# Patient Record
Sex: Male | Born: 1945 | Race: White | Hispanic: No | Marital: Married
Health system: Southern US, Community
[De-identification: ages and names within clinical notes are randomized; demographics above are authoritative.]

---

## 2007-10-31 ENCOUNTER — Other Ambulatory Visit: Admission: RE | Admit: 2007-10-31 | Discharge: 2007-10-31 | Payer: Self-pay | Admitting: Family Medicine

## 2007-10-31 ENCOUNTER — Encounter (INDEPENDENT_AMBULATORY_CARE_PROVIDER_SITE_OTHER): Payer: Self-pay | Admitting: Family Medicine

## 2009-10-28 ENCOUNTER — Ambulatory Visit (HOSPITAL_COMMUNITY): Admission: RE | Admit: 2009-10-28 | Discharge: 2009-10-28 | Payer: Self-pay | Admitting: Family Medicine

## 2011-10-26 ENCOUNTER — Other Ambulatory Visit (HOSPITAL_COMMUNITY): Payer: Self-pay | Admitting: Family Medicine

## 2011-10-26 ENCOUNTER — Ambulatory Visit (HOSPITAL_COMMUNITY)
Admission: RE | Admit: 2011-10-26 | Discharge: 2011-10-26 | Disposition: A | Discharge status: Home / Self Care | Payer: BC Managed Care – PPO | Source: Ambulatory Visit | Attending: Family Medicine | Admitting: Family Medicine

## 2011-10-26 DIAGNOSIS — Z0000 Encounter for general adult medical examination without abnormal findings: Secondary | ICD-10-CM

## 2011-10-26 DIAGNOSIS — K219 Gastro-esophageal reflux disease without esophagitis: Secondary | ICD-10-CM

## 2013-08-07 ENCOUNTER — Ambulatory Visit (HOSPITAL_COMMUNITY)
Admission: RE | Admit: 2013-08-07 | Discharge: 2013-08-07 | Disposition: A | Discharge status: Home / Self Care | Payer: BC Managed Care – PPO | Source: Ambulatory Visit | Attending: Family Medicine | Admitting: Family Medicine

## 2013-08-07 ENCOUNTER — Other Ambulatory Visit (HOSPITAL_COMMUNITY): Payer: Self-pay | Admitting: Family Medicine

## 2013-08-07 ENCOUNTER — Encounter (INDEPENDENT_AMBULATORY_CARE_PROVIDER_SITE_OTHER): Payer: Self-pay

## 2013-08-07 DIAGNOSIS — R059 Cough, unspecified: Secondary | ICD-10-CM

## 2013-08-07 DIAGNOSIS — R05 Cough: Secondary | ICD-10-CM | POA: Insufficient documentation

## 2013-08-07 DIAGNOSIS — F17200 Nicotine dependence, unspecified, uncomplicated: Secondary | ICD-10-CM | POA: Insufficient documentation

## 2014-12-18 IMAGING — CR DG CHEST 2V
2 series · 2 of 2 positions shown · non-contrast
Comparison: Chest x-ray 10/26/2011.

CLINICAL DATA: Physical exam.  Smoker.

EXAM:
CHEST  2 VIEW

[view not recorded (1 of 2)]
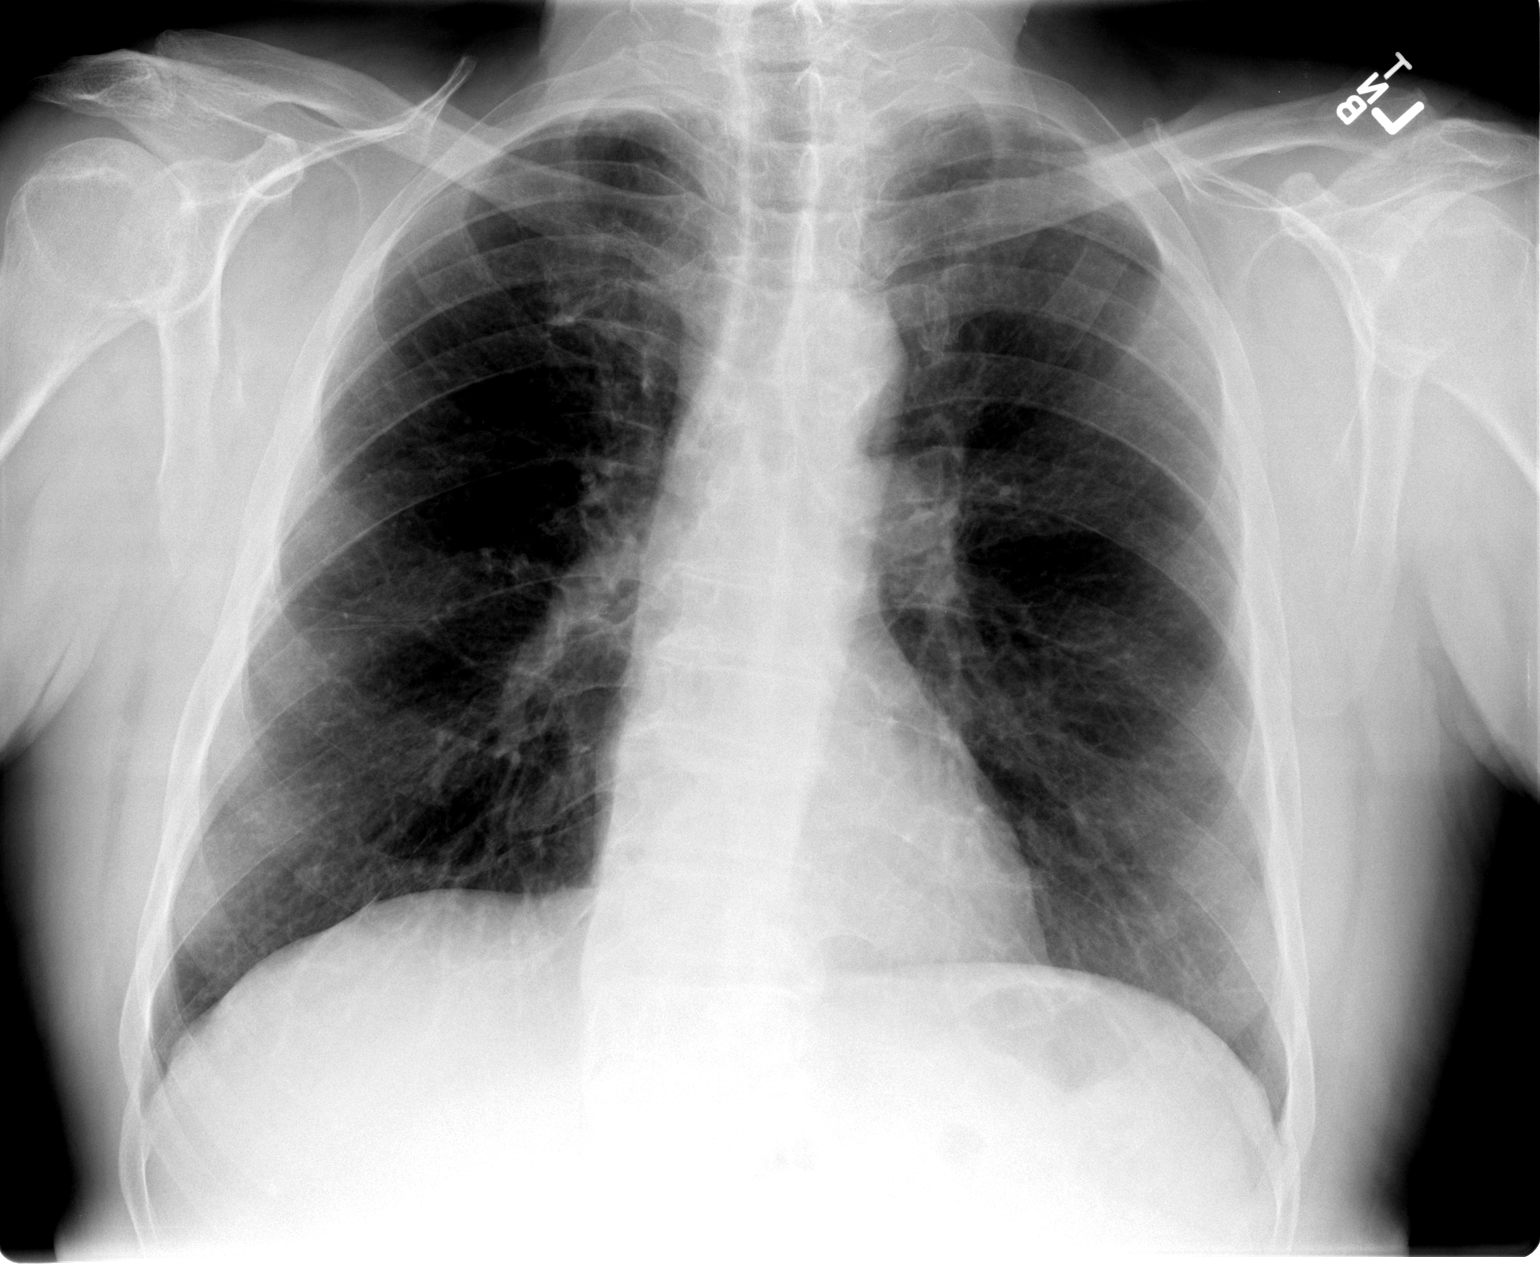

[view not recorded (2 of 2)]
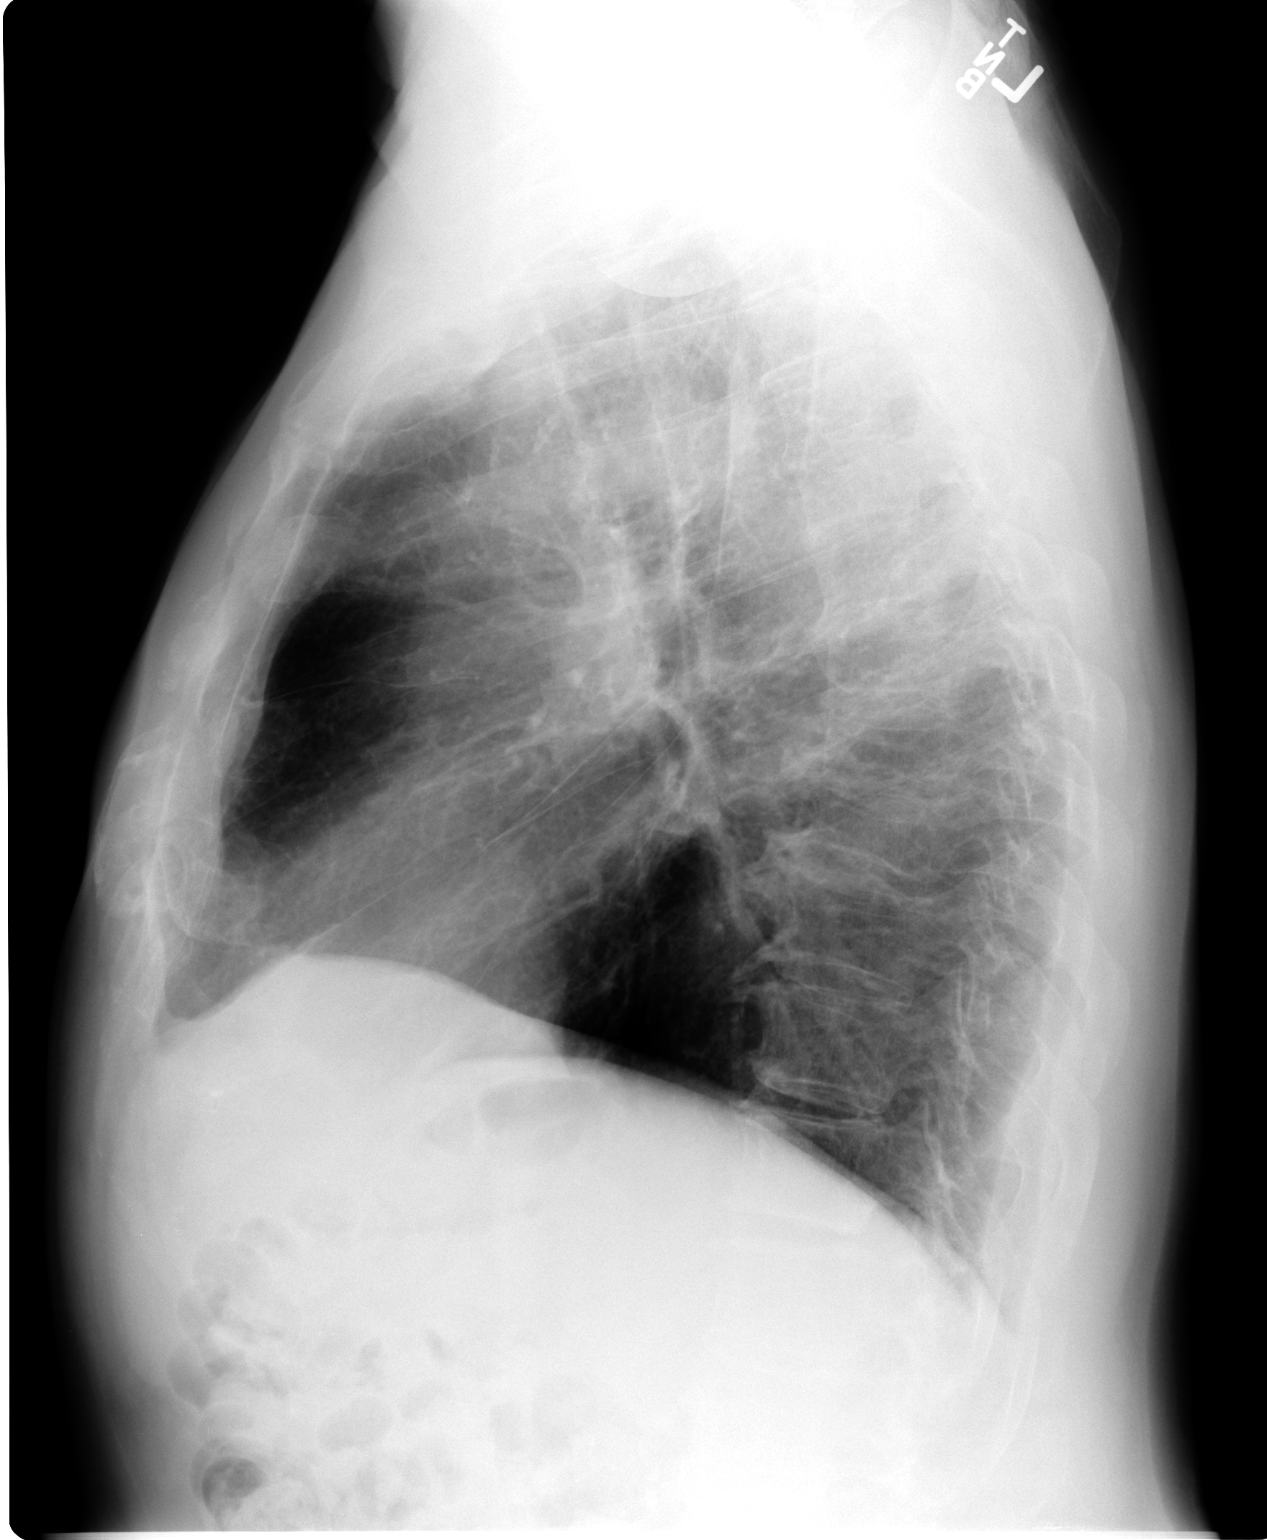

[2 of 2 positions shown; findings below may reference images not displayed]

FINDINGS: Mediastinum and hilar structures are normal. The lungs are clear.
Heart size normal. Pulmonary vascularity normal. Nipple shadows
again noted. These appear stable. No acute bony abnormality .
IMPRESSION: No active cardiopulmonary disease.

## 2015-08-24 DIAGNOSIS — Z6826 Body mass index (BMI) 26.0-26.9, adult: Secondary | ICD-10-CM | POA: Diagnosis not present

## 2015-08-24 DIAGNOSIS — I10 Essential (primary) hypertension: Secondary | ICD-10-CM | POA: Diagnosis not present

## 2015-08-25 DIAGNOSIS — R7989 Other specified abnormal findings of blood chemistry: Secondary | ICD-10-CM | POA: Diagnosis not present

## 2015-08-25 DIAGNOSIS — R011 Cardiac murmur, unspecified: Secondary | ICD-10-CM | POA: Diagnosis not present

## 2015-08-25 DIAGNOSIS — Z8241 Family history of sudden cardiac death: Secondary | ICD-10-CM | POA: Diagnosis not present

## 2015-08-25 DIAGNOSIS — Z8249 Family history of ischemic heart disease and other diseases of the circulatory system: Secondary | ICD-10-CM | POA: Diagnosis not present

## 2015-08-25 DIAGNOSIS — R5383 Other fatigue: Secondary | ICD-10-CM | POA: Diagnosis not present

## 2015-08-25 DIAGNOSIS — I10 Essential (primary) hypertension: Secondary | ICD-10-CM | POA: Diagnosis not present

## 2015-09-07 DIAGNOSIS — H02831 Dermatochalasis of right upper eyelid: Secondary | ICD-10-CM | POA: Diagnosis not present

## 2015-09-07 DIAGNOSIS — Z9889 Other specified postprocedural states: Secondary | ICD-10-CM | POA: Diagnosis not present

## 2015-09-07 DIAGNOSIS — H26491 Other secondary cataract, right eye: Secondary | ICD-10-CM | POA: Diagnosis not present

## 2015-09-07 DIAGNOSIS — Z961 Presence of intraocular lens: Secondary | ICD-10-CM | POA: Diagnosis not present

## 2015-09-07 DIAGNOSIS — H02834 Dermatochalasis of left upper eyelid: Secondary | ICD-10-CM | POA: Diagnosis not present

## 2015-09-07 DIAGNOSIS — H25812 Combined forms of age-related cataract, left eye: Secondary | ICD-10-CM | POA: Diagnosis not present

## 2015-12-06 DIAGNOSIS — Z681 Body mass index (BMI) 19 or less, adult: Secondary | ICD-10-CM | POA: Diagnosis not present

## 2015-12-06 DIAGNOSIS — Z1389 Encounter for screening for other disorder: Secondary | ICD-10-CM | POA: Diagnosis not present

## 2015-12-06 DIAGNOSIS — M791 Myalgia: Secondary | ICD-10-CM | POA: Diagnosis not present

## 2016-01-17 DIAGNOSIS — Z23 Encounter for immunization: Secondary | ICD-10-CM | POA: Diagnosis not present

## 2016-02-02 DIAGNOSIS — Z79899 Other long term (current) drug therapy: Secondary | ICD-10-CM | POA: Diagnosis not present

## 2016-02-02 DIAGNOSIS — I10 Essential (primary) hypertension: Secondary | ICD-10-CM | POA: Diagnosis not present

## 2016-02-02 DIAGNOSIS — N2889 Other specified disorders of kidney and ureter: Secondary | ICD-10-CM | POA: Diagnosis not present

## 2016-02-02 DIAGNOSIS — K3189 Other diseases of stomach and duodenum: Secondary | ICD-10-CM | POA: Diagnosis not present

## 2016-02-02 DIAGNOSIS — N3289 Other specified disorders of bladder: Secondary | ICD-10-CM | POA: Diagnosis not present

## 2016-02-02 DIAGNOSIS — R197 Diarrhea, unspecified: Secondary | ICD-10-CM | POA: Diagnosis not present

## 2016-02-02 DIAGNOSIS — F17210 Nicotine dependence, cigarettes, uncomplicated: Secondary | ICD-10-CM | POA: Diagnosis not present

## 2016-02-02 DIAGNOSIS — R918 Other nonspecific abnormal finding of lung field: Secondary | ICD-10-CM | POA: Diagnosis not present

## 2016-02-16 DIAGNOSIS — R933 Abnormal findings on diagnostic imaging of other parts of digestive tract: Secondary | ICD-10-CM | POA: Diagnosis not present

## 2016-02-16 DIAGNOSIS — I10 Essential (primary) hypertension: Secondary | ICD-10-CM | POA: Diagnosis not present

## 2016-02-16 DIAGNOSIS — Z86010 Personal history of colonic polyps: Secondary | ICD-10-CM | POA: Diagnosis not present

## 2016-03-16 DIAGNOSIS — K449 Diaphragmatic hernia without obstruction or gangrene: Secondary | ICD-10-CM | POA: Diagnosis not present

## 2016-03-16 DIAGNOSIS — R933 Abnormal findings on diagnostic imaging of other parts of digestive tract: Secondary | ICD-10-CM | POA: Diagnosis not present

## 2016-03-16 DIAGNOSIS — Z86010 Personal history of colonic polyps: Secondary | ICD-10-CM | POA: Diagnosis not present

## 2016-03-21 DIAGNOSIS — Z961 Presence of intraocular lens: Secondary | ICD-10-CM | POA: Diagnosis not present

## 2016-03-21 DIAGNOSIS — Z9841 Cataract extraction status, right eye: Secondary | ICD-10-CM | POA: Diagnosis not present

## 2016-03-21 DIAGNOSIS — H02833 Dermatochalasis of right eye, unspecified eyelid: Secondary | ICD-10-CM | POA: Diagnosis not present

## 2016-03-21 DIAGNOSIS — H25812 Combined forms of age-related cataract, left eye: Secondary | ICD-10-CM | POA: Diagnosis not present

## 2016-03-22 DIAGNOSIS — I10 Essential (primary) hypertension: Secondary | ICD-10-CM | POA: Diagnosis not present

## 2016-03-22 DIAGNOSIS — G5601 Carpal tunnel syndrome, right upper limb: Secondary | ICD-10-CM | POA: Diagnosis not present

## 2016-03-22 DIAGNOSIS — F17200 Nicotine dependence, unspecified, uncomplicated: Secondary | ICD-10-CM | POA: Diagnosis not present

## 2016-03-22 DIAGNOSIS — L409 Psoriasis, unspecified: Secondary | ICD-10-CM | POA: Diagnosis not present

## 2016-03-22 DIAGNOSIS — M791 Myalgia: Secondary | ICD-10-CM | POA: Diagnosis not present

## 2016-05-25 DIAGNOSIS — H02833 Dermatochalasis of right eye, unspecified eyelid: Secondary | ICD-10-CM | POA: Diagnosis not present

## 2016-05-25 DIAGNOSIS — F17200 Nicotine dependence, unspecified, uncomplicated: Secondary | ICD-10-CM | POA: Diagnosis not present

## 2016-05-25 DIAGNOSIS — H02403 Unspecified ptosis of bilateral eyelids: Secondary | ICD-10-CM | POA: Diagnosis not present

## 2016-05-25 DIAGNOSIS — I10 Essential (primary) hypertension: Secondary | ICD-10-CM | POA: Diagnosis not present

## 2016-05-25 DIAGNOSIS — H02423 Myogenic ptosis of bilateral eyelids: Secondary | ICD-10-CM | POA: Diagnosis not present

## 2016-05-25 DIAGNOSIS — H02834 Dermatochalasis of left upper eyelid: Secondary | ICD-10-CM | POA: Diagnosis not present

## 2016-05-25 DIAGNOSIS — H02831 Dermatochalasis of right upper eyelid: Secondary | ICD-10-CM | POA: Diagnosis not present

## 2016-05-25 DIAGNOSIS — Z9841 Cataract extraction status, right eye: Secondary | ICD-10-CM | POA: Diagnosis not present

## 2016-05-25 DIAGNOSIS — H02836 Dermatochalasis of left eye, unspecified eyelid: Secondary | ICD-10-CM | POA: Diagnosis not present

## 2016-05-25 DIAGNOSIS — Z961 Presence of intraocular lens: Secondary | ICD-10-CM | POA: Diagnosis not present

## 2016-05-25 DIAGNOSIS — H25812 Combined forms of age-related cataract, left eye: Secondary | ICD-10-CM | POA: Diagnosis not present

## 2016-08-09 DIAGNOSIS — H02839 Dermatochalasis of unspecified eye, unspecified eyelid: Secondary | ICD-10-CM | POA: Diagnosis not present

## 2016-08-30 DIAGNOSIS — H02839 Dermatochalasis of unspecified eye, unspecified eyelid: Secondary | ICD-10-CM | POA: Diagnosis not present

## 2016-08-30 DIAGNOSIS — L908 Other atrophic disorders of skin: Secondary | ICD-10-CM | POA: Diagnosis not present

## 2016-10-29 DIAGNOSIS — H25812 Combined forms of age-related cataract, left eye: Secondary | ICD-10-CM | POA: Diagnosis not present

## 2016-10-29 DIAGNOSIS — Z8669 Personal history of other diseases of the nervous system and sense organs: Secondary | ICD-10-CM | POA: Diagnosis not present

## 2016-10-29 DIAGNOSIS — Z9841 Cataract extraction status, right eye: Secondary | ICD-10-CM | POA: Diagnosis not present

## 2016-10-29 DIAGNOSIS — Z961 Presence of intraocular lens: Secondary | ICD-10-CM | POA: Diagnosis not present

## 2016-11-15 DIAGNOSIS — Z6825 Body mass index (BMI) 25.0-25.9, adult: Secondary | ICD-10-CM | POA: Diagnosis not present

## 2016-11-15 DIAGNOSIS — Z0000 Encounter for general adult medical examination without abnormal findings: Secondary | ICD-10-CM | POA: Diagnosis not present

## 2016-11-15 DIAGNOSIS — Z1389 Encounter for screening for other disorder: Secondary | ICD-10-CM | POA: Diagnosis not present

## 2016-11-15 DIAGNOSIS — Z23 Encounter for immunization: Secondary | ICD-10-CM | POA: Diagnosis not present

## 2016-11-15 DIAGNOSIS — E663 Overweight: Secondary | ICD-10-CM | POA: Diagnosis not present

## 2016-11-15 DIAGNOSIS — R918 Other nonspecific abnormal finding of lung field: Secondary | ICD-10-CM | POA: Diagnosis not present

## 2016-11-15 DIAGNOSIS — L448 Other specified papulosquamous disorders: Secondary | ICD-10-CM | POA: Diagnosis not present

## 2016-12-17 DIAGNOSIS — J439 Emphysema, unspecified: Secondary | ICD-10-CM | POA: Diagnosis not present

## 2017-01-09 DIAGNOSIS — Z23 Encounter for immunization: Secondary | ICD-10-CM | POA: Diagnosis not present

## 2017-03-07 DIAGNOSIS — D225 Melanocytic nevi of trunk: Secondary | ICD-10-CM | POA: Diagnosis not present

## 2017-03-07 DIAGNOSIS — D2239 Melanocytic nevi of other parts of face: Secondary | ICD-10-CM | POA: Diagnosis not present

## 2017-03-07 DIAGNOSIS — L578 Other skin changes due to chronic exposure to nonionizing radiation: Secondary | ICD-10-CM | POA: Diagnosis not present

## 2017-03-07 DIAGNOSIS — L570 Actinic keratosis: Secondary | ICD-10-CM | POA: Diagnosis not present

## 2017-03-21 DIAGNOSIS — H02839 Dermatochalasis of unspecified eye, unspecified eyelid: Secondary | ICD-10-CM | POA: Diagnosis not present

## 2017-06-17 DIAGNOSIS — Z961 Presence of intraocular lens: Secondary | ICD-10-CM | POA: Diagnosis not present

## 2017-06-17 DIAGNOSIS — Z9889 Other specified postprocedural states: Secondary | ICD-10-CM | POA: Diagnosis not present

## 2017-06-17 DIAGNOSIS — H25812 Combined forms of age-related cataract, left eye: Secondary | ICD-10-CM | POA: Diagnosis not present

## 2017-06-17 DIAGNOSIS — Z9841 Cataract extraction status, right eye: Secondary | ICD-10-CM | POA: Diagnosis not present

## 2017-08-22 DIAGNOSIS — L570 Actinic keratosis: Secondary | ICD-10-CM | POA: Diagnosis not present

## 2018-01-09 DIAGNOSIS — Z6826 Body mass index (BMI) 26.0-26.9, adult: Secondary | ICD-10-CM | POA: Diagnosis not present

## 2018-01-09 DIAGNOSIS — Z720 Tobacco use: Secondary | ICD-10-CM | POA: Diagnosis not present

## 2018-01-09 DIAGNOSIS — G4730 Sleep apnea, unspecified: Secondary | ICD-10-CM | POA: Diagnosis not present

## 2018-01-09 DIAGNOSIS — Z0001 Encounter for general adult medical examination with abnormal findings: Secondary | ICD-10-CM | POA: Diagnosis not present

## 2018-01-09 DIAGNOSIS — L448 Other specified papulosquamous disorders: Secondary | ICD-10-CM | POA: Diagnosis not present

## 2018-01-09 DIAGNOSIS — R7309 Other abnormal glucose: Secondary | ICD-10-CM | POA: Diagnosis not present

## 2018-01-09 DIAGNOSIS — I10 Essential (primary) hypertension: Secondary | ICD-10-CM | POA: Diagnosis not present

## 2018-01-09 DIAGNOSIS — R918 Other nonspecific abnormal finding of lung field: Secondary | ICD-10-CM | POA: Diagnosis not present

## 2018-01-09 DIAGNOSIS — R5383 Other fatigue: Secondary | ICD-10-CM | POA: Diagnosis not present

## 2018-01-09 DIAGNOSIS — Z1389 Encounter for screening for other disorder: Secondary | ICD-10-CM | POA: Diagnosis not present

## 2018-01-09 DIAGNOSIS — I4519 Other right bundle-branch block: Secondary | ICD-10-CM | POA: Diagnosis not present

## 2018-01-15 DIAGNOSIS — H02839 Dermatochalasis of unspecified eye, unspecified eyelid: Secondary | ICD-10-CM | POA: Diagnosis not present

## 2018-01-28 DIAGNOSIS — R208 Other disturbances of skin sensation: Secondary | ICD-10-CM | POA: Diagnosis not present

## 2018-01-28 DIAGNOSIS — L820 Inflamed seborrheic keratosis: Secondary | ICD-10-CM | POA: Diagnosis not present

## 2018-01-28 DIAGNOSIS — L570 Actinic keratosis: Secondary | ICD-10-CM | POA: Diagnosis not present

## 2018-12-26 DIAGNOSIS — L570 Actinic keratosis: Secondary | ICD-10-CM | POA: Diagnosis not present

## 2018-12-26 DIAGNOSIS — L400 Psoriasis vulgaris: Secondary | ICD-10-CM | POA: Diagnosis not present

## 2018-12-26 DIAGNOSIS — L814 Other melanin hyperpigmentation: Secondary | ICD-10-CM | POA: Diagnosis not present

## 2018-12-26 DIAGNOSIS — L821 Other seborrheic keratosis: Secondary | ICD-10-CM | POA: Diagnosis not present

## 2018-12-26 DIAGNOSIS — L578 Other skin changes due to chronic exposure to nonionizing radiation: Secondary | ICD-10-CM | POA: Diagnosis not present

## 2018-12-26 DIAGNOSIS — L820 Inflamed seborrheic keratosis: Secondary | ICD-10-CM | POA: Diagnosis not present

## 2019-01-13 DIAGNOSIS — Z23 Encounter for immunization: Secondary | ICD-10-CM | POA: Diagnosis not present

## 2019-01-29 DIAGNOSIS — I4519 Other right bundle-branch block: Secondary | ICD-10-CM | POA: Diagnosis not present

## 2019-01-29 DIAGNOSIS — Z23 Encounter for immunization: Secondary | ICD-10-CM | POA: Diagnosis not present

## 2019-01-29 DIAGNOSIS — L448 Other specified papulosquamous disorders: Secondary | ICD-10-CM | POA: Diagnosis not present

## 2019-01-29 DIAGNOSIS — Z6826 Body mass index (BMI) 26.0-26.9, adult: Secondary | ICD-10-CM | POA: Diagnosis not present

## 2019-01-29 DIAGNOSIS — I10 Essential (primary) hypertension: Secondary | ICD-10-CM | POA: Diagnosis not present

## 2019-01-29 DIAGNOSIS — Z719 Counseling, unspecified: Secondary | ICD-10-CM | POA: Diagnosis not present

## 2019-01-29 DIAGNOSIS — Z0001 Encounter for general adult medical examination with abnormal findings: Secondary | ICD-10-CM | POA: Diagnosis not present

## 2019-02-12 DIAGNOSIS — F17210 Nicotine dependence, cigarettes, uncomplicated: Secondary | ICD-10-CM | POA: Diagnosis not present

## 2019-05-18 DIAGNOSIS — I97190 Other postprocedural cardiac functional disturbances following cardiac surgery: Secondary | ICD-10-CM | POA: Diagnosis not present

## 2019-05-18 DIAGNOSIS — D696 Thrombocytopenia, unspecified: Secondary | ICD-10-CM | POA: Diagnosis not present

## 2019-05-18 DIAGNOSIS — K219 Gastro-esophageal reflux disease without esophagitis: Secondary | ICD-10-CM | POA: Diagnosis not present

## 2019-05-18 DIAGNOSIS — R143 Flatulence: Secondary | ICD-10-CM | POA: Diagnosis not present

## 2019-05-18 DIAGNOSIS — G8918 Other acute postprocedural pain: Secondary | ICD-10-CM | POA: Diagnosis not present

## 2019-05-18 DIAGNOSIS — J90 Pleural effusion, not elsewhere classified: Secondary | ICD-10-CM | POA: Diagnosis not present

## 2019-05-18 DIAGNOSIS — R079 Chest pain, unspecified: Secondary | ICD-10-CM | POA: Diagnosis not present

## 2019-05-18 DIAGNOSIS — I4891 Unspecified atrial fibrillation: Secondary | ICD-10-CM | POA: Diagnosis not present

## 2019-05-18 DIAGNOSIS — R0789 Other chest pain: Secondary | ICD-10-CM | POA: Diagnosis not present

## 2019-05-18 DIAGNOSIS — Z01810 Encounter for preprocedural cardiovascular examination: Secondary | ICD-10-CM | POA: Diagnosis not present

## 2019-05-18 DIAGNOSIS — R4182 Altered mental status, unspecified: Secondary | ICD-10-CM | POA: Diagnosis not present

## 2019-05-18 DIAGNOSIS — F17210 Nicotine dependence, cigarettes, uncomplicated: Secondary | ICD-10-CM | POA: Diagnosis not present

## 2019-05-18 DIAGNOSIS — F05 Delirium due to known physiological condition: Secondary | ICD-10-CM | POA: Diagnosis not present

## 2019-05-18 DIAGNOSIS — J9383 Other pneumothorax: Secondary | ICD-10-CM | POA: Diagnosis not present

## 2019-05-18 DIAGNOSIS — Z20822 Contact with and (suspected) exposure to covid-19: Secondary | ICD-10-CM | POA: Diagnosis not present

## 2019-05-18 DIAGNOSIS — Z951 Presence of aortocoronary bypass graft: Secondary | ICD-10-CM | POA: Diagnosis not present

## 2019-05-18 DIAGNOSIS — D62 Acute posthemorrhagic anemia: Secondary | ICD-10-CM | POA: Diagnosis not present

## 2019-05-18 DIAGNOSIS — I25119 Atherosclerotic heart disease of native coronary artery with unspecified angina pectoris: Secondary | ICD-10-CM | POA: Diagnosis not present

## 2019-05-18 DIAGNOSIS — E871 Hypo-osmolality and hyponatremia: Secondary | ICD-10-CM | POA: Diagnosis not present

## 2019-05-18 DIAGNOSIS — I160 Hypertensive urgency: Secondary | ICD-10-CM | POA: Diagnosis not present

## 2019-05-18 DIAGNOSIS — J9811 Atelectasis: Secondary | ICD-10-CM | POA: Diagnosis not present

## 2019-05-18 DIAGNOSIS — J9601 Acute respiratory failure with hypoxia: Secondary | ICD-10-CM | POA: Diagnosis not present

## 2019-05-18 DIAGNOSIS — I10 Essential (primary) hypertension: Secondary | ICD-10-CM | POA: Diagnosis not present

## 2019-05-18 DIAGNOSIS — I214 Non-ST elevation (NSTEMI) myocardial infarction: Secondary | ICD-10-CM | POA: Diagnosis not present

## 2019-05-18 DIAGNOSIS — R05 Cough: Secondary | ICD-10-CM | POA: Diagnosis not present

## 2019-05-18 DIAGNOSIS — I2510 Atherosclerotic heart disease of native coronary artery without angina pectoris: Secondary | ICD-10-CM | POA: Diagnosis not present

## 2019-05-18 DIAGNOSIS — K449 Diaphragmatic hernia without obstruction or gangrene: Secondary | ICD-10-CM | POA: Diagnosis not present

## 2019-05-18 DIAGNOSIS — I2584 Coronary atherosclerosis due to calcified coronary lesion: Secondary | ICD-10-CM | POA: Diagnosis not present

## 2019-05-18 DIAGNOSIS — E785 Hyperlipidemia, unspecified: Secondary | ICD-10-CM | POA: Diagnosis not present

## 2019-05-18 DIAGNOSIS — Z01818 Encounter for other preprocedural examination: Secondary | ICD-10-CM | POA: Diagnosis not present

## 2019-05-18 DIAGNOSIS — R918 Other nonspecific abnormal finding of lung field: Secondary | ICD-10-CM | POA: Diagnosis not present

## 2019-05-18 DIAGNOSIS — Z9689 Presence of other specified functional implants: Secondary | ICD-10-CM | POA: Diagnosis not present

## 2019-05-18 DIAGNOSIS — R9431 Abnormal electrocardiogram [ECG] [EKG]: Secondary | ICD-10-CM | POA: Diagnosis not present

## 2019-05-25 DIAGNOSIS — F17210 Nicotine dependence, cigarettes, uncomplicated: Secondary | ICD-10-CM | POA: Diagnosis not present

## 2019-05-25 DIAGNOSIS — Z01818 Encounter for other preprocedural examination: Secondary | ICD-10-CM | POA: Diagnosis not present

## 2019-05-29 DIAGNOSIS — E871 Hypo-osmolality and hyponatremia: Secondary | ICD-10-CM | POA: Diagnosis not present

## 2019-06-09 DIAGNOSIS — Z1389 Encounter for screening for other disorder: Secondary | ICD-10-CM | POA: Diagnosis not present

## 2019-06-09 DIAGNOSIS — Z6827 Body mass index (BMI) 27.0-27.9, adult: Secondary | ICD-10-CM | POA: Diagnosis not present

## 2019-06-09 DIAGNOSIS — I2510 Atherosclerotic heart disease of native coronary artery without angina pectoris: Secondary | ICD-10-CM | POA: Diagnosis not present

## 2019-06-09 DIAGNOSIS — E663 Overweight: Secondary | ICD-10-CM | POA: Diagnosis not present

## 2019-06-09 DIAGNOSIS — D649 Anemia, unspecified: Secondary | ICD-10-CM | POA: Diagnosis not present

## 2019-06-09 DIAGNOSIS — I219 Acute myocardial infarction, unspecified: Secondary | ICD-10-CM | POA: Diagnosis not present

## 2019-06-16 DIAGNOSIS — Z951 Presence of aortocoronary bypass graft: Secondary | ICD-10-CM | POA: Diagnosis not present

## 2019-06-16 DIAGNOSIS — I4891 Unspecified atrial fibrillation: Secondary | ICD-10-CM | POA: Diagnosis not present

## 2019-06-16 DIAGNOSIS — I9789 Other postprocedural complications and disorders of the circulatory system, not elsewhere classified: Secondary | ICD-10-CM | POA: Diagnosis not present

## 2019-06-18 DIAGNOSIS — Z951 Presence of aortocoronary bypass graft: Secondary | ICD-10-CM | POA: Diagnosis not present

## 2019-06-21 DIAGNOSIS — I4891 Unspecified atrial fibrillation: Secondary | ICD-10-CM | POA: Diagnosis not present

## 2019-06-22 DIAGNOSIS — Z951 Presence of aortocoronary bypass graft: Secondary | ICD-10-CM | POA: Diagnosis not present

## 2019-06-24 DIAGNOSIS — I10 Essential (primary) hypertension: Secondary | ICD-10-CM | POA: Diagnosis not present

## 2019-06-24 DIAGNOSIS — I219 Acute myocardial infarction, unspecified: Secondary | ICD-10-CM | POA: Diagnosis not present

## 2019-06-24 DIAGNOSIS — Z6826 Body mass index (BMI) 26.0-26.9, adult: Secondary | ICD-10-CM | POA: Diagnosis not present

## 2019-06-24 DIAGNOSIS — D649 Anemia, unspecified: Secondary | ICD-10-CM | POA: Diagnosis not present

## 2019-06-24 DIAGNOSIS — E663 Overweight: Secondary | ICD-10-CM | POA: Diagnosis not present

## 2019-06-29 DIAGNOSIS — Z951 Presence of aortocoronary bypass graft: Secondary | ICD-10-CM | POA: Diagnosis not present

## 2019-07-01 DIAGNOSIS — Z951 Presence of aortocoronary bypass graft: Secondary | ICD-10-CM | POA: Diagnosis not present

## 2019-07-02 DIAGNOSIS — Z951 Presence of aortocoronary bypass graft: Secondary | ICD-10-CM | POA: Diagnosis not present

## 2019-07-06 DIAGNOSIS — Z951 Presence of aortocoronary bypass graft: Secondary | ICD-10-CM | POA: Diagnosis not present

## 2019-07-08 DIAGNOSIS — Z951 Presence of aortocoronary bypass graft: Secondary | ICD-10-CM | POA: Diagnosis not present

## 2019-07-13 DIAGNOSIS — Z951 Presence of aortocoronary bypass graft: Secondary | ICD-10-CM | POA: Diagnosis not present

## 2019-07-14 DIAGNOSIS — Z7901 Long term (current) use of anticoagulants: Secondary | ICD-10-CM | POA: Diagnosis not present

## 2019-07-14 DIAGNOSIS — R001 Bradycardia, unspecified: Secondary | ICD-10-CM | POA: Diagnosis not present

## 2019-07-14 DIAGNOSIS — Z951 Presence of aortocoronary bypass graft: Secondary | ICD-10-CM | POA: Diagnosis not present

## 2019-07-14 DIAGNOSIS — Z87891 Personal history of nicotine dependence: Secondary | ICD-10-CM | POA: Diagnosis not present

## 2019-07-14 DIAGNOSIS — I10 Essential (primary) hypertension: Secondary | ICD-10-CM | POA: Diagnosis not present

## 2019-07-14 DIAGNOSIS — E785 Hyperlipidemia, unspecified: Secondary | ICD-10-CM | POA: Diagnosis not present

## 2019-07-14 DIAGNOSIS — I2510 Atherosclerotic heart disease of native coronary artery without angina pectoris: Secondary | ICD-10-CM | POA: Diagnosis not present

## 2019-07-14 DIAGNOSIS — I4891 Unspecified atrial fibrillation: Secondary | ICD-10-CM | POA: Diagnosis not present

## 2019-07-15 DIAGNOSIS — Z951 Presence of aortocoronary bypass graft: Secondary | ICD-10-CM | POA: Diagnosis not present

## 2019-07-16 DIAGNOSIS — I10 Essential (primary) hypertension: Secondary | ICD-10-CM | POA: Diagnosis not present

## 2019-07-16 DIAGNOSIS — I252 Old myocardial infarction: Secondary | ICD-10-CM | POA: Diagnosis not present

## 2019-07-16 DIAGNOSIS — Z951 Presence of aortocoronary bypass graft: Secondary | ICD-10-CM | POA: Diagnosis not present

## 2019-07-16 DIAGNOSIS — I4819 Other persistent atrial fibrillation: Secondary | ICD-10-CM | POA: Diagnosis not present

## 2019-07-16 DIAGNOSIS — E785 Hyperlipidemia, unspecified: Secondary | ICD-10-CM | POA: Diagnosis not present

## 2019-07-16 DIAGNOSIS — I2510 Atherosclerotic heart disease of native coronary artery without angina pectoris: Secondary | ICD-10-CM | POA: Diagnosis not present

## 2019-07-20 DIAGNOSIS — Z951 Presence of aortocoronary bypass graft: Secondary | ICD-10-CM | POA: Diagnosis not present

## 2019-07-22 DIAGNOSIS — I2510 Atherosclerotic heart disease of native coronary artery without angina pectoris: Secondary | ICD-10-CM | POA: Diagnosis not present

## 2019-07-22 DIAGNOSIS — I10 Essential (primary) hypertension: Secondary | ICD-10-CM | POA: Diagnosis not present

## 2019-07-22 DIAGNOSIS — E785 Hyperlipidemia, unspecified: Secondary | ICD-10-CM | POA: Diagnosis not present

## 2019-08-12 DIAGNOSIS — Z951 Presence of aortocoronary bypass graft: Secondary | ICD-10-CM | POA: Diagnosis not present

## 2019-08-12 DIAGNOSIS — Z48812 Encounter for surgical aftercare following surgery on the circulatory system: Secondary | ICD-10-CM | POA: Diagnosis not present

## 2019-08-13 DIAGNOSIS — I2510 Atherosclerotic heart disease of native coronary artery without angina pectoris: Secondary | ICD-10-CM | POA: Diagnosis not present

## 2019-08-13 DIAGNOSIS — E871 Hypo-osmolality and hyponatremia: Secondary | ICD-10-CM | POA: Diagnosis not present

## 2019-08-13 DIAGNOSIS — I97190 Other postprocedural cardiac functional disturbances following cardiac surgery: Secondary | ICD-10-CM | POA: Diagnosis not present

## 2019-08-13 DIAGNOSIS — G8929 Other chronic pain: Secondary | ICD-10-CM | POA: Diagnosis not present

## 2019-08-13 DIAGNOSIS — I10 Essential (primary) hypertension: Secondary | ICD-10-CM | POA: Diagnosis not present

## 2019-08-13 DIAGNOSIS — Z7982 Long term (current) use of aspirin: Secondary | ICD-10-CM | POA: Diagnosis not present

## 2019-08-13 DIAGNOSIS — Z951 Presence of aortocoronary bypass graft: Secondary | ICD-10-CM | POA: Diagnosis not present

## 2019-08-13 DIAGNOSIS — Z7901 Long term (current) use of anticoagulants: Secondary | ICD-10-CM | POA: Diagnosis not present

## 2019-08-13 DIAGNOSIS — Z48812 Encounter for surgical aftercare following surgery on the circulatory system: Secondary | ICD-10-CM | POA: Diagnosis not present

## 2019-08-13 DIAGNOSIS — Z87891 Personal history of nicotine dependence: Secondary | ICD-10-CM | POA: Diagnosis not present

## 2019-08-13 DIAGNOSIS — I252 Old myocardial infarction: Secondary | ICD-10-CM | POA: Diagnosis not present

## 2019-08-13 DIAGNOSIS — M25511 Pain in right shoulder: Secondary | ICD-10-CM | POA: Diagnosis not present

## 2019-08-13 DIAGNOSIS — I4819 Other persistent atrial fibrillation: Secondary | ICD-10-CM | POA: Diagnosis not present

## 2019-08-17 DIAGNOSIS — Z48812 Encounter for surgical aftercare following surgery on the circulatory system: Secondary | ICD-10-CM | POA: Diagnosis not present

## 2019-08-17 DIAGNOSIS — Z951 Presence of aortocoronary bypass graft: Secondary | ICD-10-CM | POA: Diagnosis not present

## 2019-08-18 DIAGNOSIS — M50322 Other cervical disc degeneration at C5-C6 level: Secondary | ICD-10-CM | POA: Diagnosis not present

## 2019-08-18 DIAGNOSIS — M4802 Spinal stenosis, cervical region: Secondary | ICD-10-CM | POA: Diagnosis not present

## 2019-08-18 DIAGNOSIS — M25511 Pain in right shoulder: Secondary | ICD-10-CM | POA: Diagnosis not present

## 2019-08-18 DIAGNOSIS — M47812 Spondylosis without myelopathy or radiculopathy, cervical region: Secondary | ICD-10-CM | POA: Diagnosis not present

## 2019-08-18 DIAGNOSIS — M542 Cervicalgia: Secondary | ICD-10-CM | POA: Diagnosis not present

## 2019-08-18 DIAGNOSIS — M50321 Other cervical disc degeneration at C4-C5 level: Secondary | ICD-10-CM | POA: Diagnosis not present

## 2019-08-18 DIAGNOSIS — M5031 Other cervical disc degeneration,  high cervical region: Secondary | ICD-10-CM | POA: Diagnosis not present

## 2019-08-18 DIAGNOSIS — G8929 Other chronic pain: Secondary | ICD-10-CM | POA: Diagnosis not present

## 2019-08-18 DIAGNOSIS — M19011 Primary osteoarthritis, right shoulder: Secondary | ICD-10-CM | POA: Diagnosis not present

## 2019-08-18 DIAGNOSIS — M50323 Other cervical disc degeneration at C6-C7 level: Secondary | ICD-10-CM | POA: Diagnosis not present

## 2019-08-19 DIAGNOSIS — Z48812 Encounter for surgical aftercare following surgery on the circulatory system: Secondary | ICD-10-CM | POA: Diagnosis not present

## 2019-08-19 DIAGNOSIS — Z951 Presence of aortocoronary bypass graft: Secondary | ICD-10-CM | POA: Diagnosis not present

## 2019-08-20 DIAGNOSIS — M47812 Spondylosis without myelopathy or radiculopathy, cervical region: Secondary | ICD-10-CM | POA: Diagnosis not present

## 2019-08-20 DIAGNOSIS — M12811 Other specific arthropathies, not elsewhere classified, right shoulder: Secondary | ICD-10-CM | POA: Diagnosis not present

## 2019-08-20 DIAGNOSIS — Z48812 Encounter for surgical aftercare following surgery on the circulatory system: Secondary | ICD-10-CM | POA: Diagnosis not present

## 2019-08-20 DIAGNOSIS — Z951 Presence of aortocoronary bypass graft: Secondary | ICD-10-CM | POA: Diagnosis not present

## 2019-08-24 DIAGNOSIS — Z951 Presence of aortocoronary bypass graft: Secondary | ICD-10-CM | POA: Diagnosis not present

## 2019-08-24 DIAGNOSIS — Z48812 Encounter for surgical aftercare following surgery on the circulatory system: Secondary | ICD-10-CM | POA: Diagnosis not present

## 2019-08-24 DIAGNOSIS — L820 Inflamed seborrheic keratosis: Secondary | ICD-10-CM | POA: Diagnosis not present

## 2019-08-24 DIAGNOSIS — L728 Other follicular cysts of the skin and subcutaneous tissue: Secondary | ICD-10-CM | POA: Diagnosis not present

## 2019-08-24 DIAGNOSIS — L570 Actinic keratosis: Secondary | ICD-10-CM | POA: Diagnosis not present

## 2019-08-26 DIAGNOSIS — Z48812 Encounter for surgical aftercare following surgery on the circulatory system: Secondary | ICD-10-CM | POA: Diagnosis not present

## 2019-08-26 DIAGNOSIS — Z951 Presence of aortocoronary bypass graft: Secondary | ICD-10-CM | POA: Diagnosis not present

## 2019-08-27 DIAGNOSIS — Z48812 Encounter for surgical aftercare following surgery on the circulatory system: Secondary | ICD-10-CM | POA: Diagnosis not present

## 2019-08-27 DIAGNOSIS — Z951 Presence of aortocoronary bypass graft: Secondary | ICD-10-CM | POA: Diagnosis not present

## 2019-08-27 DIAGNOSIS — I214 Non-ST elevation (NSTEMI) myocardial infarction: Secondary | ICD-10-CM | POA: Diagnosis not present

## 2019-12-03 DIAGNOSIS — E785 Hyperlipidemia, unspecified: Secondary | ICD-10-CM | POA: Diagnosis not present

## 2019-12-03 DIAGNOSIS — I2510 Atherosclerotic heart disease of native coronary artery without angina pectoris: Secondary | ICD-10-CM | POA: Diagnosis not present

## 2019-12-03 DIAGNOSIS — R918 Other nonspecific abnormal finding of lung field: Secondary | ICD-10-CM | POA: Diagnosis not present

## 2019-12-03 DIAGNOSIS — I10 Essential (primary) hypertension: Secondary | ICD-10-CM | POA: Diagnosis not present

## 2020-01-04 DIAGNOSIS — M47812 Spondylosis without myelopathy or radiculopathy, cervical region: Secondary | ICD-10-CM | POA: Diagnosis not present

## 2020-01-04 DIAGNOSIS — Z951 Presence of aortocoronary bypass graft: Secondary | ICD-10-CM | POA: Diagnosis not present

## 2020-01-04 DIAGNOSIS — Z23 Encounter for immunization: Secondary | ICD-10-CM | POA: Diagnosis not present

## 2020-01-04 DIAGNOSIS — Z7901 Long term (current) use of anticoagulants: Secondary | ICD-10-CM | POA: Diagnosis not present

## 2020-01-04 DIAGNOSIS — M12811 Other specific arthropathies, not elsewhere classified, right shoulder: Secondary | ICD-10-CM | POA: Diagnosis not present

## 2020-01-04 DIAGNOSIS — M4722 Other spondylosis with radiculopathy, cervical region: Secondary | ICD-10-CM | POA: Diagnosis not present

## 2020-01-04 DIAGNOSIS — Z9889 Other specified postprocedural states: Secondary | ICD-10-CM | POA: Diagnosis not present

## 2020-01-04 DIAGNOSIS — M4712 Other spondylosis with myelopathy, cervical region: Secondary | ICD-10-CM | POA: Diagnosis not present

## 2020-01-04 DIAGNOSIS — I10 Essential (primary) hypertension: Secondary | ICD-10-CM | POA: Diagnosis not present

## 2020-01-04 DIAGNOSIS — M791 Myalgia, unspecified site: Secondary | ICD-10-CM | POA: Diagnosis not present

## 2020-01-04 DIAGNOSIS — I2510 Atherosclerotic heart disease of native coronary artery without angina pectoris: Secondary | ICD-10-CM | POA: Diagnosis not present

## 2020-01-04 DIAGNOSIS — R5383 Other fatigue: Secondary | ICD-10-CM | POA: Diagnosis not present

## 2020-01-04 DIAGNOSIS — Z6826 Body mass index (BMI) 26.0-26.9, adult: Secondary | ICD-10-CM | POA: Diagnosis not present

## 2020-02-16 DIAGNOSIS — Z6826 Body mass index (BMI) 26.0-26.9, adult: Secondary | ICD-10-CM | POA: Diagnosis not present

## 2020-02-16 DIAGNOSIS — R918 Other nonspecific abnormal finding of lung field: Secondary | ICD-10-CM | POA: Diagnosis not present

## 2020-02-16 DIAGNOSIS — R5383 Other fatigue: Secondary | ICD-10-CM | POA: Diagnosis not present

## 2020-02-16 DIAGNOSIS — I2510 Atherosclerotic heart disease of native coronary artery without angina pectoris: Secondary | ICD-10-CM | POA: Diagnosis not present

## 2020-02-16 DIAGNOSIS — Z0001 Encounter for general adult medical examination with abnormal findings: Secondary | ICD-10-CM | POA: Diagnosis not present

## 2020-02-16 DIAGNOSIS — I10 Essential (primary) hypertension: Secondary | ICD-10-CM | POA: Diagnosis not present

## 2020-02-16 DIAGNOSIS — Z1331 Encounter for screening for depression: Secondary | ICD-10-CM | POA: Diagnosis not present

## 2020-02-16 DIAGNOSIS — Z1389 Encounter for screening for other disorder: Secondary | ICD-10-CM | POA: Diagnosis not present

## 2020-02-16 DIAGNOSIS — I219 Acute myocardial infarction, unspecified: Secondary | ICD-10-CM | POA: Diagnosis not present

## 2020-02-16 DIAGNOSIS — R010 Benign and innocent cardiac murmurs: Secondary | ICD-10-CM | POA: Diagnosis not present

## 2020-02-16 DIAGNOSIS — M791 Myalgia, unspecified site: Secondary | ICD-10-CM | POA: Diagnosis not present

## 2020-02-19 DIAGNOSIS — M5416 Radiculopathy, lumbar region: Secondary | ICD-10-CM | POA: Diagnosis not present

## 2020-02-19 DIAGNOSIS — G959 Disease of spinal cord, unspecified: Secondary | ICD-10-CM | POA: Diagnosis not present

## 2020-02-24 DIAGNOSIS — L821 Other seborrheic keratosis: Secondary | ICD-10-CM | POA: Diagnosis not present

## 2020-02-24 DIAGNOSIS — D1801 Hemangioma of skin and subcutaneous tissue: Secondary | ICD-10-CM | POA: Diagnosis not present

## 2020-02-24 DIAGNOSIS — L814 Other melanin hyperpigmentation: Secondary | ICD-10-CM | POA: Diagnosis not present

## 2020-02-24 DIAGNOSIS — D225 Melanocytic nevi of trunk: Secondary | ICD-10-CM | POA: Diagnosis not present

## 2020-03-11 DIAGNOSIS — M4727 Other spondylosis with radiculopathy, lumbosacral region: Secondary | ICD-10-CM | POA: Diagnosis not present

## 2020-03-11 DIAGNOSIS — M5116 Intervertebral disc disorders with radiculopathy, lumbar region: Secondary | ICD-10-CM | POA: Diagnosis not present

## 2020-03-11 DIAGNOSIS — M5416 Radiculopathy, lumbar region: Secondary | ICD-10-CM | POA: Diagnosis not present

## 2020-03-11 DIAGNOSIS — M4186 Other forms of scoliosis, lumbar region: Secondary | ICD-10-CM | POA: Diagnosis not present

## 2020-03-11 DIAGNOSIS — M4726 Other spondylosis with radiculopathy, lumbar region: Secondary | ICD-10-CM | POA: Diagnosis not present

## 2020-03-11 DIAGNOSIS — R937 Abnormal findings on diagnostic imaging of other parts of musculoskeletal system: Secondary | ICD-10-CM | POA: Diagnosis not present

## 2020-03-11 DIAGNOSIS — M48061 Spinal stenosis, lumbar region without neurogenic claudication: Secondary | ICD-10-CM | POA: Diagnosis not present

## 2020-03-11 DIAGNOSIS — M5136 Other intervertebral disc degeneration, lumbar region: Secondary | ICD-10-CM | POA: Diagnosis not present

## 2020-03-11 DIAGNOSIS — M4802 Spinal stenosis, cervical region: Secondary | ICD-10-CM | POA: Diagnosis not present

## 2020-03-11 DIAGNOSIS — M4712 Other spondylosis with myelopathy, cervical region: Secondary | ICD-10-CM | POA: Diagnosis not present

## 2020-03-11 DIAGNOSIS — G959 Disease of spinal cord, unspecified: Secondary | ICD-10-CM | POA: Diagnosis not present

## 2020-03-11 DIAGNOSIS — M5117 Intervertebral disc disorders with radiculopathy, lumbosacral region: Secondary | ICD-10-CM | POA: Diagnosis not present

## 2020-03-16 DIAGNOSIS — M4316 Spondylolisthesis, lumbar region: Secondary | ICD-10-CM | POA: Diagnosis not present

## 2020-03-16 DIAGNOSIS — M47812 Spondylosis without myelopathy or radiculopathy, cervical region: Secondary | ICD-10-CM | POA: Diagnosis not present

## 2020-03-16 DIAGNOSIS — M438X6 Other specified deforming dorsopathies, lumbar region: Secondary | ICD-10-CM | POA: Diagnosis not present

## 2020-03-16 DIAGNOSIS — M48061 Spinal stenosis, lumbar region without neurogenic claudication: Secondary | ICD-10-CM | POA: Diagnosis not present

## 2020-03-16 DIAGNOSIS — M4186 Other forms of scoliosis, lumbar region: Secondary | ICD-10-CM | POA: Diagnosis not present

## 2020-03-16 DIAGNOSIS — M4802 Spinal stenosis, cervical region: Secondary | ICD-10-CM | POA: Diagnosis not present

## 2020-03-16 DIAGNOSIS — G992 Myelopathy in diseases classified elsewhere: Secondary | ICD-10-CM | POA: Diagnosis not present

## 2020-03-16 DIAGNOSIS — M5416 Radiculopathy, lumbar region: Secondary | ICD-10-CM | POA: Diagnosis not present

## 2020-03-16 DIAGNOSIS — M47816 Spondylosis without myelopathy or radiculopathy, lumbar region: Secondary | ICD-10-CM | POA: Diagnosis not present

## 2020-03-16 DIAGNOSIS — M438X5 Other specified deforming dorsopathies, thoracolumbar region: Secondary | ICD-10-CM | POA: Diagnosis not present

## 2020-03-31 DIAGNOSIS — E785 Hyperlipidemia, unspecified: Secondary | ICD-10-CM | POA: Diagnosis not present

## 2020-03-31 DIAGNOSIS — M4316 Spondylolisthesis, lumbar region: Secondary | ICD-10-CM | POA: Diagnosis not present

## 2020-03-31 DIAGNOSIS — M4802 Spinal stenosis, cervical region: Secondary | ICD-10-CM | POA: Diagnosis not present

## 2020-03-31 DIAGNOSIS — K219 Gastro-esophageal reflux disease without esophagitis: Secondary | ICD-10-CM | POA: Diagnosis not present

## 2020-03-31 DIAGNOSIS — M4312 Spondylolisthesis, cervical region: Secondary | ICD-10-CM | POA: Diagnosis not present

## 2020-03-31 DIAGNOSIS — G992 Myelopathy in diseases classified elsewhere: Secondary | ICD-10-CM | POA: Diagnosis not present

## 2020-03-31 DIAGNOSIS — Z87891 Personal history of nicotine dependence: Secondary | ICD-10-CM | POA: Diagnosis not present

## 2020-03-31 DIAGNOSIS — G959 Disease of spinal cord, unspecified: Secondary | ICD-10-CM | POA: Diagnosis not present

## 2020-03-31 DIAGNOSIS — I10 Essential (primary) hypertension: Secondary | ICD-10-CM | POA: Diagnosis not present

## 2020-03-31 DIAGNOSIS — I2510 Atherosclerotic heart disease of native coronary artery without angina pectoris: Secondary | ICD-10-CM | POA: Diagnosis not present

## 2020-06-06 DIAGNOSIS — M4180 Other forms of scoliosis, site unspecified: Secondary | ICD-10-CM | POA: Diagnosis not present

## 2020-06-06 DIAGNOSIS — G8929 Other chronic pain: Secondary | ICD-10-CM | POA: Diagnosis not present

## 2020-06-06 DIAGNOSIS — M6281 Muscle weakness (generalized): Secondary | ICD-10-CM | POA: Diagnosis not present

## 2020-06-06 DIAGNOSIS — M545 Low back pain, unspecified: Secondary | ICD-10-CM | POA: Diagnosis not present

## 2020-06-06 DIAGNOSIS — Z7409 Other reduced mobility: Secondary | ICD-10-CM | POA: Diagnosis not present

## 2020-06-06 DIAGNOSIS — G959 Disease of spinal cord, unspecified: Secondary | ICD-10-CM | POA: Diagnosis not present

## 2020-06-09 DIAGNOSIS — I4819 Other persistent atrial fibrillation: Secondary | ICD-10-CM | POA: Diagnosis not present

## 2020-06-09 DIAGNOSIS — I10 Essential (primary) hypertension: Secondary | ICD-10-CM | POA: Diagnosis not present

## 2020-11-02 DIAGNOSIS — Z9889 Other specified postprocedural states: Secondary | ICD-10-CM | POA: Diagnosis not present

## 2020-11-02 DIAGNOSIS — M5416 Radiculopathy, lumbar region: Secondary | ICD-10-CM | POA: Diagnosis not present

## 2020-11-02 DIAGNOSIS — M4180 Other forms of scoliosis, site unspecified: Secondary | ICD-10-CM | POA: Diagnosis not present

## 2020-11-03 DIAGNOSIS — M12811 Other specific arthropathies, not elsewhere classified, right shoulder: Secondary | ICD-10-CM | POA: Diagnosis not present

## 2020-12-06 DIAGNOSIS — Z6826 Body mass index (BMI) 26.0-26.9, adult: Secondary | ICD-10-CM | POA: Diagnosis not present

## 2020-12-06 DIAGNOSIS — F419 Anxiety disorder, unspecified: Secondary | ICD-10-CM | POA: Diagnosis not present

## 2020-12-06 DIAGNOSIS — E663 Overweight: Secondary | ICD-10-CM | POA: Diagnosis not present

## 2020-12-06 DIAGNOSIS — Z8616 Personal history of COVID-19: Secondary | ICD-10-CM | POA: Diagnosis not present

## 2021-02-16 DIAGNOSIS — I4819 Other persistent atrial fibrillation: Secondary | ICD-10-CM | POA: Diagnosis not present

## 2021-02-16 DIAGNOSIS — I10 Essential (primary) hypertension: Secondary | ICD-10-CM | POA: Diagnosis not present

## 2021-02-16 DIAGNOSIS — Z951 Presence of aortocoronary bypass graft: Secondary | ICD-10-CM | POA: Diagnosis not present

## 2021-02-16 DIAGNOSIS — I2510 Atherosclerotic heart disease of native coronary artery without angina pectoris: Secondary | ICD-10-CM | POA: Diagnosis not present

## 2021-03-01 DIAGNOSIS — D225 Melanocytic nevi of trunk: Secondary | ICD-10-CM | POA: Diagnosis not present

## 2021-03-01 DIAGNOSIS — L578 Other skin changes due to chronic exposure to nonionizing radiation: Secondary | ICD-10-CM | POA: Diagnosis not present

## 2021-03-01 DIAGNOSIS — L821 Other seborrheic keratosis: Secondary | ICD-10-CM | POA: Diagnosis not present

## 2021-03-01 DIAGNOSIS — L400 Psoriasis vulgaris: Secondary | ICD-10-CM | POA: Diagnosis not present

## 2021-03-20 DIAGNOSIS — Z20822 Contact with and (suspected) exposure to covid-19: Secondary | ICD-10-CM | POA: Diagnosis not present

## 2021-03-20 DIAGNOSIS — R053 Chronic cough: Secondary | ICD-10-CM | POA: Diagnosis not present

## 2021-03-20 DIAGNOSIS — R058 Other specified cough: Secondary | ICD-10-CM | POA: Diagnosis not present

## 2021-03-20 DIAGNOSIS — J40 Bronchitis, not specified as acute or chronic: Secondary | ICD-10-CM | POA: Diagnosis not present

## 2021-03-20 DIAGNOSIS — R112 Nausea with vomiting, unspecified: Secondary | ICD-10-CM | POA: Diagnosis not present

## 2021-03-20 DIAGNOSIS — R0602 Shortness of breath: Secondary | ICD-10-CM | POA: Diagnosis not present

## 2021-03-20 DIAGNOSIS — I440 Atrioventricular block, first degree: Secondary | ICD-10-CM | POA: Diagnosis not present

## 2021-03-30 DIAGNOSIS — Z13220 Encounter for screening for lipoid disorders: Secondary | ICD-10-CM | POA: Diagnosis not present

## 2021-03-30 DIAGNOSIS — I10 Essential (primary) hypertension: Secondary | ICD-10-CM | POA: Diagnosis not present

## 2021-03-30 DIAGNOSIS — I4519 Other right bundle-branch block: Secondary | ICD-10-CM | POA: Diagnosis not present

## 2021-03-30 DIAGNOSIS — F419 Anxiety disorder, unspecified: Secondary | ICD-10-CM | POA: Diagnosis not present

## 2021-03-30 DIAGNOSIS — E663 Overweight: Secondary | ICD-10-CM | POA: Diagnosis not present

## 2021-03-30 DIAGNOSIS — R5383 Other fatigue: Secondary | ICD-10-CM | POA: Diagnosis not present

## 2021-03-30 DIAGNOSIS — Z6827 Body mass index (BMI) 27.0-27.9, adult: Secondary | ICD-10-CM | POA: Diagnosis not present

## 2021-03-30 DIAGNOSIS — Z1331 Encounter for screening for depression: Secondary | ICD-10-CM | POA: Diagnosis not present

## 2021-03-30 DIAGNOSIS — I2510 Atherosclerotic heart disease of native coronary artery without angina pectoris: Secondary | ICD-10-CM | POA: Diagnosis not present

## 2021-03-30 DIAGNOSIS — R918 Other nonspecific abnormal finding of lung field: Secondary | ICD-10-CM | POA: Diagnosis not present

## 2021-03-30 DIAGNOSIS — I219 Acute myocardial infarction, unspecified: Secondary | ICD-10-CM | POA: Diagnosis not present

## 2021-03-30 DIAGNOSIS — Z0000 Encounter for general adult medical examination without abnormal findings: Secondary | ICD-10-CM | POA: Diagnosis not present

## 2021-05-04 DIAGNOSIS — D125 Benign neoplasm of sigmoid colon: Secondary | ICD-10-CM | POA: Diagnosis not present

## 2021-05-04 DIAGNOSIS — Z86010 Personal history of colonic polyps: Secondary | ICD-10-CM | POA: Diagnosis not present

## 2021-06-22 DIAGNOSIS — Z20822 Contact with and (suspected) exposure to covid-19: Secondary | ICD-10-CM | POA: Diagnosis not present

## 2021-06-22 DIAGNOSIS — J209 Acute bronchitis, unspecified: Secondary | ICD-10-CM | POA: Diagnosis not present

## 2021-06-22 DIAGNOSIS — R509 Fever, unspecified: Secondary | ICD-10-CM | POA: Diagnosis not present

## 2021-07-04 DIAGNOSIS — Z6827 Body mass index (BMI) 27.0-27.9, adult: Secondary | ICD-10-CM | POA: Diagnosis not present

## 2021-07-04 DIAGNOSIS — J449 Chronic obstructive pulmonary disease, unspecified: Secondary | ICD-10-CM | POA: Diagnosis not present

## 2021-07-04 DIAGNOSIS — J441 Chronic obstructive pulmonary disease with (acute) exacerbation: Secondary | ICD-10-CM | POA: Diagnosis not present

## 2021-07-04 DIAGNOSIS — E663 Overweight: Secondary | ICD-10-CM | POA: Diagnosis not present

## 2021-08-24 DIAGNOSIS — Z951 Presence of aortocoronary bypass graft: Secondary | ICD-10-CM | POA: Diagnosis not present

## 2021-08-24 DIAGNOSIS — I10 Essential (primary) hypertension: Secondary | ICD-10-CM | POA: Diagnosis not present

## 2021-08-24 DIAGNOSIS — I4819 Other persistent atrial fibrillation: Secondary | ICD-10-CM | POA: Diagnosis not present

## 2021-08-24 DIAGNOSIS — I2510 Atherosclerotic heart disease of native coronary artery without angina pectoris: Secondary | ICD-10-CM | POA: Diagnosis not present

## 2021-08-30 DIAGNOSIS — L821 Other seborrheic keratosis: Secondary | ICD-10-CM | POA: Diagnosis not present

## 2021-08-30 DIAGNOSIS — L400 Psoriasis vulgaris: Secondary | ICD-10-CM | POA: Diagnosis not present

## 2021-09-04 DIAGNOSIS — Z6827 Body mass index (BMI) 27.0-27.9, adult: Secondary | ICD-10-CM | POA: Diagnosis not present

## 2021-09-04 DIAGNOSIS — J449 Chronic obstructive pulmonary disease, unspecified: Secondary | ICD-10-CM | POA: Diagnosis not present

## 2021-09-04 DIAGNOSIS — Z1331 Encounter for screening for depression: Secondary | ICD-10-CM | POA: Diagnosis not present

## 2021-09-04 DIAGNOSIS — E663 Overweight: Secondary | ICD-10-CM | POA: Diagnosis not present

## 2022-01-18 DIAGNOSIS — R059 Cough, unspecified: Secondary | ICD-10-CM | POA: Diagnosis not present

## 2022-01-18 DIAGNOSIS — J209 Acute bronchitis, unspecified: Secondary | ICD-10-CM | POA: Diagnosis not present

## 2022-02-05 DIAGNOSIS — E860 Dehydration: Secondary | ICD-10-CM | POA: Diagnosis not present

## 2022-02-05 DIAGNOSIS — R112 Nausea with vomiting, unspecified: Secondary | ICD-10-CM | POA: Diagnosis not present

## 2022-02-05 DIAGNOSIS — R197 Diarrhea, unspecified: Secondary | ICD-10-CM | POA: Diagnosis not present

## 2022-02-05 DIAGNOSIS — R059 Cough, unspecified: Secondary | ICD-10-CM | POA: Diagnosis not present

## 2022-02-14 DIAGNOSIS — J441 Chronic obstructive pulmonary disease with (acute) exacerbation: Secondary | ICD-10-CM | POA: Diagnosis not present

## 2022-02-14 DIAGNOSIS — U099 Post covid-19 condition, unspecified: Secondary | ICD-10-CM | POA: Diagnosis not present

## 2022-02-14 DIAGNOSIS — J449 Chronic obstructive pulmonary disease, unspecified: Secondary | ICD-10-CM | POA: Diagnosis not present

## 2022-02-14 DIAGNOSIS — Z6827 Body mass index (BMI) 27.0-27.9, adult: Secondary | ICD-10-CM | POA: Diagnosis not present

## 2022-02-14 DIAGNOSIS — R053 Chronic cough: Secondary | ICD-10-CM | POA: Diagnosis not present

## 2022-04-09 DIAGNOSIS — R197 Diarrhea, unspecified: Secondary | ICD-10-CM | POA: Diagnosis not present

## 2022-04-09 DIAGNOSIS — Z0000 Encounter for general adult medical examination without abnormal findings: Secondary | ICD-10-CM | POA: Diagnosis not present

## 2022-04-09 DIAGNOSIS — Z0001 Encounter for general adult medical examination with abnormal findings: Secondary | ICD-10-CM | POA: Diagnosis not present

## 2022-04-09 DIAGNOSIS — E6609 Other obesity due to excess calories: Secondary | ICD-10-CM | POA: Diagnosis not present

## 2022-04-09 DIAGNOSIS — I2510 Atherosclerotic heart disease of native coronary artery without angina pectoris: Secondary | ICD-10-CM | POA: Diagnosis not present

## 2022-04-09 DIAGNOSIS — Z6828 Body mass index (BMI) 28.0-28.9, adult: Secondary | ICD-10-CM | POA: Diagnosis not present

## 2022-04-09 DIAGNOSIS — I4519 Other right bundle-branch block: Secondary | ICD-10-CM | POA: Diagnosis not present

## 2022-04-09 DIAGNOSIS — R5383 Other fatigue: Secondary | ICD-10-CM | POA: Diagnosis not present

## 2022-04-09 DIAGNOSIS — I10 Essential (primary) hypertension: Secondary | ICD-10-CM | POA: Diagnosis not present

## 2022-04-09 DIAGNOSIS — J449 Chronic obstructive pulmonary disease, unspecified: Secondary | ICD-10-CM | POA: Diagnosis not present

## 2022-06-14 DIAGNOSIS — K219 Gastro-esophageal reflux disease without esophagitis: Secondary | ICD-10-CM | POA: Diagnosis not present

## 2022-06-14 DIAGNOSIS — R197 Diarrhea, unspecified: Secondary | ICD-10-CM | POA: Diagnosis not present

## 2022-06-14 DIAGNOSIS — K529 Noninfective gastroenteritis and colitis, unspecified: Secondary | ICD-10-CM | POA: Diagnosis not present

## 2022-06-19 DIAGNOSIS — I2510 Atherosclerotic heart disease of native coronary artery without angina pectoris: Secondary | ICD-10-CM | POA: Diagnosis not present

## 2022-06-19 DIAGNOSIS — Z6826 Body mass index (BMI) 26.0-26.9, adult: Secondary | ICD-10-CM | POA: Diagnosis not present

## 2022-06-19 DIAGNOSIS — I10 Essential (primary) hypertension: Secondary | ICD-10-CM | POA: Diagnosis not present

## 2022-07-02 DIAGNOSIS — R051 Acute cough: Secondary | ICD-10-CM | POA: Diagnosis not present

## 2022-07-02 DIAGNOSIS — Z20822 Contact with and (suspected) exposure to covid-19: Secondary | ICD-10-CM | POA: Diagnosis not present

## 2022-08-14 DIAGNOSIS — E663 Overweight: Secondary | ICD-10-CM | POA: Diagnosis not present

## 2022-08-14 DIAGNOSIS — J029 Acute pharyngitis, unspecified: Secondary | ICD-10-CM | POA: Diagnosis not present

## 2022-08-14 DIAGNOSIS — J449 Chronic obstructive pulmonary disease, unspecified: Secondary | ICD-10-CM | POA: Diagnosis not present

## 2022-08-14 DIAGNOSIS — I10 Essential (primary) hypertension: Secondary | ICD-10-CM | POA: Diagnosis not present

## 2022-08-14 DIAGNOSIS — Z6827 Body mass index (BMI) 27.0-27.9, adult: Secondary | ICD-10-CM | POA: Diagnosis not present

## 2022-08-14 DIAGNOSIS — I2510 Atherosclerotic heart disease of native coronary artery without angina pectoris: Secondary | ICD-10-CM | POA: Diagnosis not present

## 2022-08-22 DIAGNOSIS — L02821 Furuncle of head [any part, except face]: Secondary | ICD-10-CM | POA: Diagnosis not present

## 2022-08-22 DIAGNOSIS — L570 Actinic keratosis: Secondary | ICD-10-CM | POA: Diagnosis not present

## 2022-08-22 DIAGNOSIS — L2089 Other atopic dermatitis: Secondary | ICD-10-CM | POA: Diagnosis not present

## 2022-08-22 DIAGNOSIS — N481 Balanitis: Secondary | ICD-10-CM | POA: Diagnosis not present

## 2022-09-13 DIAGNOSIS — R21 Rash and other nonspecific skin eruption: Secondary | ICD-10-CM | POA: Diagnosis not present

## 2022-09-13 DIAGNOSIS — L272 Dermatitis due to ingested food: Secondary | ICD-10-CM | POA: Diagnosis not present

## 2022-11-26 DIAGNOSIS — L570 Actinic keratosis: Secondary | ICD-10-CM | POA: Diagnosis not present

## 2022-11-28 DIAGNOSIS — M4125 Other idiopathic scoliosis, thoracolumbar region: Secondary | ICD-10-CM | POA: Diagnosis not present

## 2022-11-28 DIAGNOSIS — M4150 Other secondary scoliosis, site unspecified: Secondary | ICD-10-CM | POA: Diagnosis not present

## 2022-11-28 DIAGNOSIS — M5416 Radiculopathy, lumbar region: Secondary | ICD-10-CM | POA: Diagnosis not present

## 2022-11-29 DIAGNOSIS — I2510 Atherosclerotic heart disease of native coronary artery without angina pectoris: Secondary | ICD-10-CM | POA: Diagnosis not present

## 2022-11-29 DIAGNOSIS — I10 Essential (primary) hypertension: Secondary | ICD-10-CM | POA: Diagnosis not present

## 2022-11-29 DIAGNOSIS — E785 Hyperlipidemia, unspecified: Secondary | ICD-10-CM | POA: Diagnosis not present

## 2022-11-29 DIAGNOSIS — I4819 Other persistent atrial fibrillation: Secondary | ICD-10-CM | POA: Diagnosis not present

## 2023-01-02 DIAGNOSIS — Z6826 Body mass index (BMI) 26.0-26.9, adult: Secondary | ICD-10-CM | POA: Diagnosis not present

## 2023-01-02 DIAGNOSIS — I2510 Atherosclerotic heart disease of native coronary artery without angina pectoris: Secondary | ICD-10-CM | POA: Diagnosis not present

## 2023-01-02 DIAGNOSIS — J449 Chronic obstructive pulmonary disease, unspecified: Secondary | ICD-10-CM | POA: Diagnosis not present

## 2023-01-02 DIAGNOSIS — I10 Essential (primary) hypertension: Secondary | ICD-10-CM | POA: Diagnosis not present

## 2023-05-29 DIAGNOSIS — Z0001 Encounter for general adult medical examination with abnormal findings: Secondary | ICD-10-CM | POA: Diagnosis not present

## 2023-05-29 DIAGNOSIS — I10 Essential (primary) hypertension: Secondary | ICD-10-CM | POA: Diagnosis not present

## 2023-05-29 DIAGNOSIS — M5431 Sciatica, right side: Secondary | ICD-10-CM | POA: Diagnosis not present

## 2023-05-29 DIAGNOSIS — I2510 Atherosclerotic heart disease of native coronary artery without angina pectoris: Secondary | ICD-10-CM | POA: Diagnosis not present

## 2023-05-29 DIAGNOSIS — E663 Overweight: Secondary | ICD-10-CM | POA: Diagnosis not present

## 2023-05-29 DIAGNOSIS — Z6828 Body mass index (BMI) 28.0-28.9, adult: Secondary | ICD-10-CM | POA: Diagnosis not present

## 2023-05-29 DIAGNOSIS — Z1331 Encounter for screening for depression: Secondary | ICD-10-CM | POA: Diagnosis not present

## 2023-05-30 DIAGNOSIS — K080 Exfoliation of teeth due to systemic causes: Secondary | ICD-10-CM | POA: Diagnosis not present

## 2023-06-25 DIAGNOSIS — L308 Other specified dermatitis: Secondary | ICD-10-CM | POA: Diagnosis not present

## 2023-06-25 DIAGNOSIS — L570 Actinic keratosis: Secondary | ICD-10-CM | POA: Diagnosis not present

## 2023-06-25 DIAGNOSIS — L578 Other skin changes due to chronic exposure to nonionizing radiation: Secondary | ICD-10-CM | POA: Diagnosis not present

## 2023-06-25 DIAGNOSIS — L988 Other specified disorders of the skin and subcutaneous tissue: Secondary | ICD-10-CM | POA: Diagnosis not present

## 2023-06-25 DIAGNOSIS — D692 Other nonthrombocytopenic purpura: Secondary | ICD-10-CM | POA: Diagnosis not present

## 2023-06-25 DIAGNOSIS — D235 Other benign neoplasm of skin of trunk: Secondary | ICD-10-CM | POA: Diagnosis not present

## 2023-07-24 DIAGNOSIS — M48062 Spinal stenosis, lumbar region with neurogenic claudication: Secondary | ICD-10-CM | POA: Diagnosis not present

## 2023-07-24 DIAGNOSIS — M5416 Radiculopathy, lumbar region: Secondary | ICD-10-CM | POA: Diagnosis not present

## 2023-07-24 DIAGNOSIS — G8929 Other chronic pain: Secondary | ICD-10-CM | POA: Diagnosis not present

## 2023-07-24 DIAGNOSIS — M533 Sacrococcygeal disorders, not elsewhere classified: Secondary | ICD-10-CM | POA: Diagnosis not present

## 2023-07-29 DIAGNOSIS — K080 Exfoliation of teeth due to systemic causes: Secondary | ICD-10-CM | POA: Diagnosis not present

## 2023-08-14 DIAGNOSIS — K080 Exfoliation of teeth due to systemic causes: Secondary | ICD-10-CM | POA: Diagnosis not present

## 2023-08-20 DIAGNOSIS — K080 Exfoliation of teeth due to systemic causes: Secondary | ICD-10-CM | POA: Diagnosis not present

## 2023-08-22 DIAGNOSIS — M48061 Spinal stenosis, lumbar region without neurogenic claudication: Secondary | ICD-10-CM | POA: Diagnosis not present

## 2023-08-22 DIAGNOSIS — G8929 Other chronic pain: Secondary | ICD-10-CM | POA: Diagnosis not present

## 2023-08-22 DIAGNOSIS — M533 Sacrococcygeal disorders, not elsewhere classified: Secondary | ICD-10-CM | POA: Diagnosis not present

## 2023-09-07 DIAGNOSIS — M5442 Lumbago with sciatica, left side: Secondary | ICD-10-CM | POA: Diagnosis not present

## 2023-09-07 DIAGNOSIS — G8929 Other chronic pain: Secondary | ICD-10-CM | POA: Diagnosis not present

## 2023-09-09 DIAGNOSIS — M7021 Olecranon bursitis, right elbow: Secondary | ICD-10-CM | POA: Diagnosis not present

## 2023-09-09 DIAGNOSIS — S299XXA Unspecified injury of thorax, initial encounter: Secondary | ICD-10-CM | POA: Diagnosis not present

## 2023-09-09 DIAGNOSIS — M4640 Discitis, unspecified, site unspecified: Secondary | ICD-10-CM | POA: Diagnosis not present

## 2023-09-09 DIAGNOSIS — I080 Rheumatic disorders of both mitral and aortic valves: Secondary | ICD-10-CM | POA: Diagnosis not present

## 2023-09-09 DIAGNOSIS — I110 Hypertensive heart disease with heart failure: Secondary | ICD-10-CM | POA: Diagnosis not present

## 2023-09-09 DIAGNOSIS — J9601 Acute respiratory failure with hypoxia: Secondary | ICD-10-CM | POA: Diagnosis not present

## 2023-09-09 DIAGNOSIS — S3992XA Unspecified injury of lower back, initial encounter: Secondary | ICD-10-CM | POA: Diagnosis not present

## 2023-09-09 DIAGNOSIS — K5900 Constipation, unspecified: Secondary | ICD-10-CM | POA: Diagnosis not present

## 2023-09-09 DIAGNOSIS — E43 Unspecified severe protein-calorie malnutrition: Secondary | ICD-10-CM | POA: Diagnosis not present

## 2023-09-09 DIAGNOSIS — L02212 Cutaneous abscess of back [any part, except buttock]: Secondary | ICD-10-CM | POA: Diagnosis not present

## 2023-09-09 DIAGNOSIS — D696 Thrombocytopenia, unspecified: Secondary | ICD-10-CM | POA: Diagnosis not present

## 2023-09-09 DIAGNOSIS — B9561 Methicillin susceptible Staphylococcus aureus infection as the cause of diseases classified elsewhere: Secondary | ICD-10-CM | POA: Diagnosis not present

## 2023-09-09 DIAGNOSIS — K219 Gastro-esophageal reflux disease without esophagitis: Secondary | ICD-10-CM | POA: Diagnosis not present

## 2023-09-09 DIAGNOSIS — G061 Intraspinal abscess and granuloma: Secondary | ICD-10-CM | POA: Diagnosis not present

## 2023-09-09 DIAGNOSIS — R2681 Unsteadiness on feet: Secondary | ICD-10-CM | POA: Diagnosis not present

## 2023-09-09 DIAGNOSIS — I509 Heart failure, unspecified: Secondary | ICD-10-CM | POA: Diagnosis not present

## 2023-09-09 DIAGNOSIS — A4902 Methicillin resistant Staphylococcus aureus infection, unspecified site: Secondary | ICD-10-CM | POA: Diagnosis not present

## 2023-09-09 DIAGNOSIS — M47816 Spondylosis without myelopathy or radiculopathy, lumbar region: Secondary | ICD-10-CM | POA: Diagnosis not present

## 2023-09-09 DIAGNOSIS — M4646 Discitis, unspecified, lumbar region: Secondary | ICD-10-CM | POA: Diagnosis not present

## 2023-09-09 DIAGNOSIS — R0602 Shortness of breath: Secondary | ICD-10-CM | POA: Diagnosis not present

## 2023-09-09 DIAGNOSIS — R531 Weakness: Secondary | ICD-10-CM | POA: Diagnosis not present

## 2023-09-09 DIAGNOSIS — I2510 Atherosclerotic heart disease of native coronary artery without angina pectoris: Secondary | ICD-10-CM | POA: Diagnosis not present

## 2023-09-09 DIAGNOSIS — M48061 Spinal stenosis, lumbar region without neurogenic claudication: Secondary | ICD-10-CM | POA: Diagnosis not present

## 2023-09-09 DIAGNOSIS — M6281 Muscle weakness (generalized): Secondary | ICD-10-CM | POA: Diagnosis not present

## 2023-09-09 DIAGNOSIS — B9562 Methicillin resistant Staphylococcus aureus infection as the cause of diseases classified elsewhere: Secondary | ICD-10-CM | POA: Diagnosis not present

## 2023-09-09 DIAGNOSIS — G8929 Other chronic pain: Secondary | ICD-10-CM | POA: Diagnosis not present

## 2023-09-09 DIAGNOSIS — I5033 Acute on chronic diastolic (congestive) heart failure: Secondary | ICD-10-CM | POA: Diagnosis not present

## 2023-09-09 DIAGNOSIS — J441 Chronic obstructive pulmonary disease with (acute) exacerbation: Secondary | ICD-10-CM | POA: Diagnosis not present

## 2023-09-09 DIAGNOSIS — Z951 Presence of aortocoronary bypass graft: Secondary | ICD-10-CM | POA: Diagnosis not present

## 2023-09-09 DIAGNOSIS — R918 Other nonspecific abnormal finding of lung field: Secondary | ICD-10-CM | POA: Diagnosis not present

## 2023-09-09 DIAGNOSIS — M549 Dorsalgia, unspecified: Secondary | ICD-10-CM | POA: Diagnosis not present

## 2023-09-09 DIAGNOSIS — Z87891 Personal history of nicotine dependence: Secondary | ICD-10-CM | POA: Diagnosis not present

## 2023-09-09 DIAGNOSIS — R609 Edema, unspecified: Secondary | ICD-10-CM | POA: Diagnosis not present

## 2023-09-09 DIAGNOSIS — M4626 Osteomyelitis of vertebra, lumbar region: Secondary | ICD-10-CM | POA: Diagnosis not present

## 2023-09-09 DIAGNOSIS — M47814 Spondylosis without myelopathy or radiculopathy, thoracic region: Secondary | ICD-10-CM | POA: Diagnosis not present

## 2023-09-09 DIAGNOSIS — Z681 Body mass index (BMI) 19 or less, adult: Secondary | ICD-10-CM | POA: Diagnosis not present

## 2023-09-09 DIAGNOSIS — R319 Hematuria, unspecified: Secondary | ICD-10-CM | POA: Diagnosis not present

## 2023-09-09 DIAGNOSIS — E871 Hypo-osmolality and hyponatremia: Secondary | ICD-10-CM | POA: Diagnosis not present

## 2023-09-09 DIAGNOSIS — G062 Extradural and subdural abscess, unspecified: Secondary | ICD-10-CM | POA: Diagnosis not present

## 2023-09-09 DIAGNOSIS — Z720 Tobacco use: Secondary | ICD-10-CM | POA: Diagnosis not present

## 2023-09-09 DIAGNOSIS — J9809 Other diseases of bronchus, not elsewhere classified: Secondary | ICD-10-CM | POA: Diagnosis not present

## 2023-09-09 DIAGNOSIS — J182 Hypostatic pneumonia, unspecified organism: Secondary | ICD-10-CM | POA: Diagnosis not present

## 2023-09-09 DIAGNOSIS — R262 Difficulty in walking, not elsewhere classified: Secondary | ICD-10-CM | POA: Diagnosis not present

## 2023-09-09 DIAGNOSIS — D539 Nutritional anemia, unspecified: Secondary | ICD-10-CM | POA: Diagnosis not present

## 2023-09-09 DIAGNOSIS — R0902 Hypoxemia: Secondary | ICD-10-CM | POA: Diagnosis not present

## 2023-09-09 DIAGNOSIS — I959 Hypotension, unspecified: Secondary | ICD-10-CM | POA: Diagnosis not present

## 2023-09-19 DIAGNOSIS — G061 Intraspinal abscess and granuloma: Secondary | ICD-10-CM | POA: Diagnosis not present

## 2023-09-29 DIAGNOSIS — M6281 Muscle weakness (generalized): Secondary | ICD-10-CM | POA: Diagnosis not present

## 2023-09-29 DIAGNOSIS — I5030 Unspecified diastolic (congestive) heart failure: Secondary | ICD-10-CM | POA: Diagnosis not present

## 2023-09-29 DIAGNOSIS — G061 Intraspinal abscess and granuloma: Secondary | ICD-10-CM | POA: Diagnosis not present

## 2023-09-29 DIAGNOSIS — J9811 Atelectasis: Secondary | ICD-10-CM | POA: Diagnosis not present

## 2023-09-29 DIAGNOSIS — R7989 Other specified abnormal findings of blood chemistry: Secondary | ICD-10-CM | POA: Diagnosis not present

## 2023-09-29 DIAGNOSIS — E871 Hypo-osmolality and hyponatremia: Secondary | ICD-10-CM | POA: Diagnosis not present

## 2023-09-29 DIAGNOSIS — M4620 Osteomyelitis of vertebra, site unspecified: Secondary | ICD-10-CM | POA: Diagnosis not present

## 2023-09-29 DIAGNOSIS — R21 Rash and other nonspecific skin eruption: Secondary | ICD-10-CM | POA: Diagnosis not present

## 2023-09-29 DIAGNOSIS — J441 Chronic obstructive pulmonary disease with (acute) exacerbation: Secondary | ICD-10-CM | POA: Diagnosis not present

## 2023-09-29 DIAGNOSIS — G4700 Insomnia, unspecified: Secondary | ICD-10-CM | POA: Diagnosis not present

## 2023-09-29 DIAGNOSIS — R9431 Abnormal electrocardiogram [ECG] [EKG]: Secondary | ICD-10-CM | POA: Diagnosis not present

## 2023-09-29 DIAGNOSIS — R197 Diarrhea, unspecified: Secondary | ICD-10-CM | POA: Diagnosis not present

## 2023-09-29 DIAGNOSIS — R4182 Altered mental status, unspecified: Secondary | ICD-10-CM | POA: Diagnosis not present

## 2023-09-29 DIAGNOSIS — M25421 Effusion, right elbow: Secondary | ICD-10-CM | POA: Diagnosis not present

## 2023-09-29 DIAGNOSIS — J449 Chronic obstructive pulmonary disease, unspecified: Secondary | ICD-10-CM | POA: Diagnosis not present

## 2023-09-29 DIAGNOSIS — I10 Essential (primary) hypertension: Secondary | ICD-10-CM | POA: Diagnosis not present

## 2023-09-29 DIAGNOSIS — R609 Edema, unspecified: Secondary | ICD-10-CM | POA: Diagnosis not present

## 2023-09-29 DIAGNOSIS — E785 Hyperlipidemia, unspecified: Secondary | ICD-10-CM | POA: Diagnosis not present

## 2023-09-29 DIAGNOSIS — R55 Syncope and collapse: Secondary | ICD-10-CM | POA: Diagnosis not present

## 2023-09-29 DIAGNOSIS — M549 Dorsalgia, unspecified: Secondary | ICD-10-CM | POA: Diagnosis not present

## 2023-09-29 DIAGNOSIS — T50905D Adverse effect of unspecified drugs, medicaments and biological substances, subsequent encounter: Secondary | ICD-10-CM | POA: Diagnosis not present

## 2023-09-29 DIAGNOSIS — J42 Unspecified chronic bronchitis: Secondary | ICD-10-CM | POA: Diagnosis not present

## 2023-09-29 DIAGNOSIS — K219 Gastro-esophageal reflux disease without esophagitis: Secondary | ICD-10-CM | POA: Diagnosis not present

## 2023-09-29 DIAGNOSIS — M4640 Discitis, unspecified, site unspecified: Secondary | ICD-10-CM | POA: Diagnosis not present

## 2023-09-29 DIAGNOSIS — R0600 Dyspnea, unspecified: Secondary | ICD-10-CM | POA: Diagnosis not present

## 2023-09-29 DIAGNOSIS — D696 Thrombocytopenia, unspecified: Secondary | ICD-10-CM | POA: Diagnosis not present

## 2023-09-29 DIAGNOSIS — D649 Anemia, unspecified: Secondary | ICD-10-CM | POA: Diagnosis not present

## 2023-09-29 DIAGNOSIS — J90 Pleural effusion, not elsewhere classified: Secondary | ICD-10-CM | POA: Diagnosis not present

## 2023-09-29 DIAGNOSIS — J9601 Acute respiratory failure with hypoxia: Secondary | ICD-10-CM | POA: Diagnosis not present

## 2023-09-29 DIAGNOSIS — A4902 Methicillin resistant Staphylococcus aureus infection, unspecified site: Secondary | ICD-10-CM | POA: Diagnosis not present

## 2023-09-29 DIAGNOSIS — I2510 Atherosclerotic heart disease of native coronary artery without angina pectoris: Secondary | ICD-10-CM | POA: Diagnosis not present

## 2023-09-29 DIAGNOSIS — E8809 Other disorders of plasma-protein metabolism, not elsewhere classified: Secondary | ICD-10-CM | POA: Diagnosis not present

## 2023-09-29 DIAGNOSIS — R531 Weakness: Secondary | ICD-10-CM | POA: Diagnosis not present

## 2023-09-29 DIAGNOSIS — R2681 Unsteadiness on feet: Secondary | ICD-10-CM | POA: Diagnosis not present

## 2023-09-29 DIAGNOSIS — R0602 Shortness of breath: Secondary | ICD-10-CM | POA: Diagnosis not present

## 2023-09-29 DIAGNOSIS — M4646 Discitis, unspecified, lumbar region: Secondary | ICD-10-CM | POA: Diagnosis not present

## 2023-09-29 DIAGNOSIS — B9562 Methicillin resistant Staphylococcus aureus infection as the cause of diseases classified elsewhere: Secondary | ICD-10-CM | POA: Diagnosis not present

## 2023-09-29 DIAGNOSIS — Z978 Presence of other specified devices: Secondary | ICD-10-CM | POA: Diagnosis not present

## 2023-09-29 DIAGNOSIS — Z79899 Other long term (current) drug therapy: Secondary | ICD-10-CM | POA: Diagnosis not present

## 2023-09-29 DIAGNOSIS — R1084 Generalized abdominal pain: Secondary | ICD-10-CM | POA: Diagnosis not present

## 2023-09-29 DIAGNOSIS — M25561 Pain in right knee: Secondary | ICD-10-CM | POA: Diagnosis not present

## 2023-09-29 DIAGNOSIS — R0902 Hypoxemia: Secondary | ICD-10-CM | POA: Diagnosis not present

## 2023-09-29 DIAGNOSIS — I5032 Chronic diastolic (congestive) heart failure: Secondary | ICD-10-CM | POA: Diagnosis not present

## 2023-09-29 DIAGNOSIS — M4626 Osteomyelitis of vertebra, lumbar region: Secondary | ICD-10-CM | POA: Diagnosis not present

## 2023-09-29 DIAGNOSIS — D721 Eosinophilia, unspecified: Secondary | ICD-10-CM | POA: Diagnosis not present

## 2023-09-29 DIAGNOSIS — R918 Other nonspecific abnormal finding of lung field: Secondary | ICD-10-CM | POA: Diagnosis not present

## 2023-09-29 DIAGNOSIS — K6812 Psoas muscle abscess: Secondary | ICD-10-CM | POA: Diagnosis not present

## 2023-09-29 DIAGNOSIS — T380X5A Adverse effect of glucocorticoids and synthetic analogues, initial encounter: Secondary | ICD-10-CM | POA: Diagnosis not present

## 2023-09-29 DIAGNOSIS — Z720 Tobacco use: Secondary | ICD-10-CM | POA: Diagnosis not present

## 2023-09-29 DIAGNOSIS — I5033 Acute on chronic diastolic (congestive) heart failure: Secondary | ICD-10-CM | POA: Diagnosis not present

## 2023-09-29 DIAGNOSIS — Z9981 Dependence on supplemental oxygen: Secondary | ICD-10-CM | POA: Diagnosis not present

## 2023-09-29 DIAGNOSIS — Z792 Long term (current) use of antibiotics: Secondary | ICD-10-CM | POA: Diagnosis not present

## 2023-09-29 DIAGNOSIS — D539 Nutritional anemia, unspecified: Secondary | ICD-10-CM | POA: Diagnosis not present

## 2023-09-29 DIAGNOSIS — I110 Hypertensive heart disease with heart failure: Secondary | ICD-10-CM | POA: Diagnosis not present

## 2023-09-29 DIAGNOSIS — R262 Difficulty in walking, not elsewhere classified: Secondary | ICD-10-CM | POA: Diagnosis not present

## 2023-09-29 DIAGNOSIS — Z743 Need for continuous supervision: Secondary | ICD-10-CM | POA: Diagnosis not present

## 2023-09-29 DIAGNOSIS — R7982 Elevated C-reactive protein (CRP): Secondary | ICD-10-CM | POA: Diagnosis not present

## 2023-09-29 DIAGNOSIS — N179 Acute kidney failure, unspecified: Secondary | ICD-10-CM | POA: Diagnosis not present

## 2023-09-29 DIAGNOSIS — Z5181 Encounter for therapeutic drug level monitoring: Secondary | ICD-10-CM | POA: Diagnosis not present

## 2023-09-29 DIAGNOSIS — J810 Acute pulmonary edema: Secondary | ICD-10-CM | POA: Diagnosis not present

## 2023-09-29 DIAGNOSIS — E440 Moderate protein-calorie malnutrition: Secondary | ICD-10-CM | POA: Diagnosis not present

## 2023-09-29 DIAGNOSIS — R5381 Other malaise: Secondary | ICD-10-CM | POA: Diagnosis not present

## 2023-09-29 DIAGNOSIS — Z951 Presence of aortocoronary bypass graft: Secondary | ICD-10-CM | POA: Diagnosis not present

## 2023-09-29 DIAGNOSIS — A419 Sepsis, unspecified organism: Secondary | ICD-10-CM | POA: Diagnosis not present

## 2023-09-29 DIAGNOSIS — R600 Localized edema: Secondary | ICD-10-CM | POA: Diagnosis not present

## 2023-09-29 DIAGNOSIS — Z87891 Personal history of nicotine dependence: Secondary | ICD-10-CM | POA: Diagnosis not present

## 2023-09-29 DIAGNOSIS — R400 Somnolence: Secondary | ICD-10-CM | POA: Diagnosis not present

## 2023-09-29 DIAGNOSIS — I959 Hypotension, unspecified: Secondary | ICD-10-CM | POA: Diagnosis not present

## 2023-09-29 DIAGNOSIS — Z7982 Long term (current) use of aspirin: Secondary | ICD-10-CM | POA: Diagnosis not present

## 2023-09-29 DIAGNOSIS — M7021 Olecranon bursitis, right elbow: Secondary | ICD-10-CM | POA: Diagnosis not present

## 2023-09-29 DIAGNOSIS — J9691 Respiratory failure, unspecified with hypoxia: Secondary | ICD-10-CM | POA: Diagnosis not present

## 2023-09-29 DIAGNOSIS — M79661 Pain in right lower leg: Secondary | ICD-10-CM | POA: Diagnosis not present

## 2023-09-29 DIAGNOSIS — K5900 Constipation, unspecified: Secondary | ICD-10-CM | POA: Diagnosis not present

## 2023-09-29 DIAGNOSIS — R52 Pain, unspecified: Secondary | ICD-10-CM | POA: Diagnosis not present

## 2023-09-29 DIAGNOSIS — G062 Extradural and subdural abscess, unspecified: Secondary | ICD-10-CM | POA: Diagnosis not present

## 2023-09-29 DIAGNOSIS — D7212 Drug rash with eosinophilia and systemic symptoms syndrome: Secondary | ICD-10-CM | POA: Diagnosis not present

## 2023-09-29 DIAGNOSIS — G8929 Other chronic pain: Secondary | ICD-10-CM | POA: Diagnosis not present

## 2023-09-29 DIAGNOSIS — R700 Elevated erythrocyte sedimentation rate: Secondary | ICD-10-CM | POA: Diagnosis not present

## 2023-09-30 DIAGNOSIS — J449 Chronic obstructive pulmonary disease, unspecified: Secondary | ICD-10-CM | POA: Diagnosis not present

## 2023-09-30 DIAGNOSIS — J9691 Respiratory failure, unspecified with hypoxia: Secondary | ICD-10-CM | POA: Diagnosis not present

## 2023-09-30 DIAGNOSIS — G062 Extradural and subdural abscess, unspecified: Secondary | ICD-10-CM | POA: Diagnosis not present

## 2023-09-30 DIAGNOSIS — M4646 Discitis, unspecified, lumbar region: Secondary | ICD-10-CM | POA: Diagnosis not present

## 2023-10-01 DIAGNOSIS — Z87891 Personal history of nicotine dependence: Secondary | ICD-10-CM | POA: Diagnosis not present

## 2023-10-01 DIAGNOSIS — D649 Anemia, unspecified: Secondary | ICD-10-CM | POA: Diagnosis not present

## 2023-10-01 DIAGNOSIS — Z79899 Other long term (current) drug therapy: Secondary | ICD-10-CM | POA: Diagnosis not present

## 2023-10-01 DIAGNOSIS — R4182 Altered mental status, unspecified: Secondary | ICD-10-CM | POA: Diagnosis not present

## 2023-10-01 DIAGNOSIS — R400 Somnolence: Secondary | ICD-10-CM | POA: Diagnosis not present

## 2023-10-01 DIAGNOSIS — R55 Syncope and collapse: Secondary | ICD-10-CM | POA: Diagnosis not present

## 2023-10-01 DIAGNOSIS — J42 Unspecified chronic bronchitis: Secondary | ICD-10-CM | POA: Diagnosis not present

## 2023-10-01 DIAGNOSIS — J810 Acute pulmonary edema: Secondary | ICD-10-CM | POA: Diagnosis not present

## 2023-10-01 DIAGNOSIS — I10 Essential (primary) hypertension: Secondary | ICD-10-CM | POA: Diagnosis not present

## 2023-10-01 DIAGNOSIS — J9811 Atelectasis: Secondary | ICD-10-CM | POA: Diagnosis not present

## 2023-10-01 DIAGNOSIS — R0602 Shortness of breath: Secondary | ICD-10-CM | POA: Diagnosis not present

## 2023-10-01 DIAGNOSIS — J9691 Respiratory failure, unspecified with hypoxia: Secondary | ICD-10-CM | POA: Diagnosis not present

## 2023-10-01 DIAGNOSIS — R7989 Other specified abnormal findings of blood chemistry: Secondary | ICD-10-CM | POA: Diagnosis not present

## 2023-10-03 DIAGNOSIS — J9691 Respiratory failure, unspecified with hypoxia: Secondary | ICD-10-CM | POA: Diagnosis not present

## 2023-10-03 DIAGNOSIS — M4646 Discitis, unspecified, lumbar region: Secondary | ICD-10-CM | POA: Diagnosis not present

## 2023-10-03 DIAGNOSIS — R5381 Other malaise: Secondary | ICD-10-CM | POA: Diagnosis not present

## 2023-10-03 DIAGNOSIS — R0600 Dyspnea, unspecified: Secondary | ICD-10-CM | POA: Diagnosis not present

## 2023-10-03 DIAGNOSIS — G062 Extradural and subdural abscess, unspecified: Secondary | ICD-10-CM | POA: Diagnosis not present

## 2023-10-03 DIAGNOSIS — J9601 Acute respiratory failure with hypoxia: Secondary | ICD-10-CM | POA: Diagnosis not present

## 2023-10-07 DIAGNOSIS — G4700 Insomnia, unspecified: Secondary | ICD-10-CM | POA: Diagnosis not present

## 2023-10-07 DIAGNOSIS — R52 Pain, unspecified: Secondary | ICD-10-CM | POA: Diagnosis not present

## 2023-10-07 DIAGNOSIS — R7982 Elevated C-reactive protein (CRP): Secondary | ICD-10-CM | POA: Diagnosis not present

## 2023-10-07 DIAGNOSIS — I10 Essential (primary) hypertension: Secondary | ICD-10-CM | POA: Diagnosis not present

## 2023-10-07 DIAGNOSIS — R700 Elevated erythrocyte sedimentation rate: Secondary | ICD-10-CM | POA: Diagnosis not present

## 2023-10-08 DIAGNOSIS — E440 Moderate protein-calorie malnutrition: Secondary | ICD-10-CM | POA: Diagnosis not present

## 2023-10-08 DIAGNOSIS — M4646 Discitis, unspecified, lumbar region: Secondary | ICD-10-CM | POA: Diagnosis not present

## 2023-10-08 DIAGNOSIS — I5030 Unspecified diastolic (congestive) heart failure: Secondary | ICD-10-CM | POA: Diagnosis not present

## 2023-10-08 DIAGNOSIS — I110 Hypertensive heart disease with heart failure: Secondary | ICD-10-CM | POA: Diagnosis not present

## 2023-10-14 DIAGNOSIS — R609 Edema, unspecified: Secondary | ICD-10-CM | POA: Diagnosis not present

## 2023-10-14 DIAGNOSIS — I110 Hypertensive heart disease with heart failure: Secondary | ICD-10-CM | POA: Diagnosis not present

## 2023-10-14 DIAGNOSIS — G4700 Insomnia, unspecified: Secondary | ICD-10-CM | POA: Diagnosis not present

## 2023-10-14 DIAGNOSIS — M25561 Pain in right knee: Secondary | ICD-10-CM | POA: Diagnosis not present

## 2023-10-21 DIAGNOSIS — M79661 Pain in right lower leg: Secondary | ICD-10-CM | POA: Diagnosis not present

## 2023-10-21 DIAGNOSIS — M25421 Effusion, right elbow: Secondary | ICD-10-CM | POA: Diagnosis not present

## 2023-10-21 DIAGNOSIS — M4640 Discitis, unspecified, site unspecified: Secondary | ICD-10-CM | POA: Diagnosis not present

## 2023-10-21 DIAGNOSIS — R600 Localized edema: Secondary | ICD-10-CM | POA: Diagnosis not present

## 2023-10-21 DIAGNOSIS — R918 Other nonspecific abnormal finding of lung field: Secondary | ICD-10-CM | POA: Diagnosis not present

## 2023-10-21 DIAGNOSIS — R7989 Other specified abnormal findings of blood chemistry: Secondary | ICD-10-CM | POA: Diagnosis not present

## 2023-10-21 DIAGNOSIS — R9431 Abnormal electrocardiogram [ECG] [EKG]: Secondary | ICD-10-CM | POA: Diagnosis not present

## 2023-10-21 DIAGNOSIS — M25561 Pain in right knee: Secondary | ICD-10-CM | POA: Diagnosis not present

## 2023-10-21 DIAGNOSIS — R609 Edema, unspecified: Secondary | ICD-10-CM | POA: Diagnosis not present

## 2023-10-21 DIAGNOSIS — E8809 Other disorders of plasma-protein metabolism, not elsewhere classified: Secondary | ICD-10-CM | POA: Diagnosis not present

## 2023-10-21 DIAGNOSIS — R5381 Other malaise: Secondary | ICD-10-CM | POA: Diagnosis not present

## 2023-10-21 DIAGNOSIS — M4646 Discitis, unspecified, lumbar region: Secondary | ICD-10-CM | POA: Diagnosis not present

## 2023-10-21 DIAGNOSIS — M7021 Olecranon bursitis, right elbow: Secondary | ICD-10-CM | POA: Diagnosis not present

## 2023-10-24 DIAGNOSIS — I110 Hypertensive heart disease with heart failure: Secondary | ICD-10-CM | POA: Diagnosis not present

## 2023-10-24 DIAGNOSIS — I5030 Unspecified diastolic (congestive) heart failure: Secondary | ICD-10-CM | POA: Diagnosis not present

## 2023-10-24 DIAGNOSIS — R609 Edema, unspecified: Secondary | ICD-10-CM | POA: Diagnosis not present

## 2023-10-28 DIAGNOSIS — R609 Edema, unspecified: Secondary | ICD-10-CM | POA: Diagnosis not present

## 2023-10-28 DIAGNOSIS — G062 Extradural and subdural abscess, unspecified: Secondary | ICD-10-CM | POA: Diagnosis not present

## 2023-10-28 DIAGNOSIS — M25561 Pain in right knee: Secondary | ICD-10-CM | POA: Diagnosis not present

## 2023-10-28 DIAGNOSIS — R5381 Other malaise: Secondary | ICD-10-CM | POA: Diagnosis not present

## 2023-10-28 DIAGNOSIS — M4620 Osteomyelitis of vertebra, site unspecified: Secondary | ICD-10-CM | POA: Diagnosis not present

## 2023-10-28 DIAGNOSIS — M4646 Discitis, unspecified, lumbar region: Secondary | ICD-10-CM | POA: Diagnosis not present

## 2023-10-28 DIAGNOSIS — Z792 Long term (current) use of antibiotics: Secondary | ICD-10-CM | POA: Diagnosis not present

## 2023-10-28 DIAGNOSIS — A4902 Methicillin resistant Staphylococcus aureus infection, unspecified site: Secondary | ICD-10-CM | POA: Diagnosis not present

## 2023-10-28 DIAGNOSIS — R21 Rash and other nonspecific skin eruption: Secondary | ICD-10-CM | POA: Diagnosis not present

## 2023-10-31 DIAGNOSIS — R21 Rash and other nonspecific skin eruption: Secondary | ICD-10-CM | POA: Diagnosis not present

## 2023-10-31 DIAGNOSIS — R609 Edema, unspecified: Secondary | ICD-10-CM | POA: Diagnosis not present

## 2023-11-01 DIAGNOSIS — E878 Other disorders of electrolyte and fluid balance, not elsewhere classified: Secondary | ICD-10-CM | POA: Diagnosis not present

## 2023-11-01 DIAGNOSIS — I440 Atrioventricular block, first degree: Secondary | ICD-10-CM | POA: Diagnosis not present

## 2023-11-01 DIAGNOSIS — Z978 Presence of other specified devices: Secondary | ICD-10-CM | POA: Diagnosis not present

## 2023-11-01 DIAGNOSIS — M4620 Osteomyelitis of vertebra, site unspecified: Secondary | ICD-10-CM | POA: Diagnosis not present

## 2023-11-01 DIAGNOSIS — D7212 Drug rash with eosinophilia and systemic symptoms syndrome: Secondary | ICD-10-CM | POA: Diagnosis not present

## 2023-11-01 DIAGNOSIS — R531 Weakness: Secondary | ICD-10-CM | POA: Diagnosis not present

## 2023-11-01 DIAGNOSIS — D696 Thrombocytopenia, unspecified: Secondary | ICD-10-CM | POA: Diagnosis not present

## 2023-11-01 DIAGNOSIS — Z951 Presence of aortocoronary bypass graft: Secondary | ICD-10-CM | POA: Diagnosis not present

## 2023-11-01 DIAGNOSIS — I959 Hypotension, unspecified: Secondary | ICD-10-CM | POA: Diagnosis not present

## 2023-11-01 DIAGNOSIS — G061 Intraspinal abscess and granuloma: Secondary | ICD-10-CM | POA: Diagnosis not present

## 2023-11-01 DIAGNOSIS — R0902 Hypoxemia: Secondary | ICD-10-CM | POA: Diagnosis not present

## 2023-11-01 DIAGNOSIS — Z743 Need for continuous supervision: Secondary | ICD-10-CM | POA: Diagnosis not present

## 2023-11-01 DIAGNOSIS — M4646 Discitis, unspecified, lumbar region: Secondary | ICD-10-CM | POA: Diagnosis not present

## 2023-11-01 DIAGNOSIS — K6812 Psoas muscle abscess: Secondary | ICD-10-CM | POA: Diagnosis not present

## 2023-11-01 DIAGNOSIS — R079 Chest pain, unspecified: Secondary | ICD-10-CM | POA: Diagnosis not present

## 2023-11-01 DIAGNOSIS — K219 Gastro-esophageal reflux disease without esophagitis: Secondary | ICD-10-CM | POA: Diagnosis not present

## 2023-11-01 DIAGNOSIS — T380X5A Adverse effect of glucocorticoids and synthetic analogues, initial encounter: Secondary | ICD-10-CM | POA: Diagnosis not present

## 2023-11-01 DIAGNOSIS — J90 Pleural effusion, not elsewhere classified: Secondary | ICD-10-CM | POA: Diagnosis not present

## 2023-11-01 DIAGNOSIS — I2510 Atherosclerotic heart disease of native coronary artery without angina pectoris: Secondary | ICD-10-CM | POA: Diagnosis not present

## 2023-11-01 DIAGNOSIS — J441 Chronic obstructive pulmonary disease with (acute) exacerbation: Secondary | ICD-10-CM | POA: Diagnosis not present

## 2023-11-01 DIAGNOSIS — D649 Anemia, unspecified: Secondary | ICD-10-CM | POA: Diagnosis not present

## 2023-11-01 DIAGNOSIS — N179 Acute kidney failure, unspecified: Secondary | ICD-10-CM | POA: Diagnosis not present

## 2023-11-01 DIAGNOSIS — R9431 Abnormal electrocardiogram [ECG] [EKG]: Secondary | ICD-10-CM | POA: Diagnosis not present

## 2023-11-01 DIAGNOSIS — R21 Rash and other nonspecific skin eruption: Secondary | ICD-10-CM | POA: Diagnosis not present

## 2023-11-01 DIAGNOSIS — R1084 Generalized abdominal pain: Secondary | ICD-10-CM | POA: Diagnosis not present

## 2023-11-01 DIAGNOSIS — G062 Extradural and subdural abscess, unspecified: Secondary | ICD-10-CM | POA: Diagnosis not present

## 2023-11-01 DIAGNOSIS — I110 Hypertensive heart disease with heart failure: Secondary | ICD-10-CM | POA: Diagnosis not present

## 2023-11-01 DIAGNOSIS — D721 Eosinophilia, unspecified: Secondary | ICD-10-CM | POA: Diagnosis not present

## 2023-11-01 DIAGNOSIS — E785 Hyperlipidemia, unspecified: Secondary | ICD-10-CM | POA: Diagnosis not present

## 2023-11-01 DIAGNOSIS — I5032 Chronic diastolic (congestive) heart failure: Secondary | ICD-10-CM | POA: Diagnosis not present

## 2023-11-01 DIAGNOSIS — Z79899 Other long term (current) drug therapy: Secondary | ICD-10-CM | POA: Diagnosis not present

## 2023-11-01 DIAGNOSIS — A4902 Methicillin resistant Staphylococcus aureus infection, unspecified site: Secondary | ICD-10-CM | POA: Diagnosis not present

## 2023-11-01 DIAGNOSIS — R918 Other nonspecific abnormal finding of lung field: Secondary | ICD-10-CM | POA: Diagnosis not present

## 2023-11-01 DIAGNOSIS — M7021 Olecranon bursitis, right elbow: Secondary | ICD-10-CM | POA: Diagnosis not present

## 2023-11-01 DIAGNOSIS — T50905D Adverse effect of unspecified drugs, medicaments and biological substances, subsequent encounter: Secondary | ICD-10-CM | POA: Diagnosis not present

## 2023-11-01 DIAGNOSIS — M25521 Pain in right elbow: Secondary | ICD-10-CM | POA: Diagnosis not present

## 2023-11-01 DIAGNOSIS — B9562 Methicillin resistant Staphylococcus aureus infection as the cause of diseases classified elsewhere: Secondary | ICD-10-CM | POA: Diagnosis not present

## 2023-11-01 DIAGNOSIS — M4626 Osteomyelitis of vertebra, lumbar region: Secondary | ICD-10-CM | POA: Diagnosis not present

## 2023-11-01 DIAGNOSIS — A419 Sepsis, unspecified organism: Secondary | ICD-10-CM | POA: Diagnosis not present

## 2023-11-01 DIAGNOSIS — Z7982 Long term (current) use of aspirin: Secondary | ICD-10-CM | POA: Diagnosis not present

## 2023-11-01 DIAGNOSIS — R197 Diarrhea, unspecified: Secondary | ICD-10-CM | POA: Diagnosis not present

## 2023-11-01 DIAGNOSIS — E871 Hypo-osmolality and hyponatremia: Secondary | ICD-10-CM | POA: Diagnosis not present

## 2023-11-01 NOTE — ED Provider Notes (Signed)
 Transfer of Care Note HPI/ROS: I assumed care of Tristan Smith on 11/01/2023 at sign out from previous provider. Please refer to the original provider's note for additional information regarding the care full information of care.  Briefly, Tristan Smith is a 78 y.o. male patient who is here today:  ED Course as of 11/01/23 1624  Fri Nov 01, 2023  1326 Creatinine(!): 1.47 [GG]  1507 Stable 66M hfpef, CAD prior CABG, admitted to outside [facility] for a month right elbow bursitit,s lumbar spine discitis with epidural abscess, bilateral spinal s, osteomyelitis, vanco to dapto 0on 29th transition. New rash developed last Thursday.   Sepsis picture here, febrile tachy, soft bp at pcp. DRESS syndrome. Pulmonary, ID on board. Derm is on board.  [JL]    ED Course User Index [GG] Laverda Karie Rimes, MD [JL] Fairy Kerby Revere, MD     Medical History[1] Surgical History[2] Family History[3] Social History   Socioeconomic History  . Marital status: Married    Spouse name: Not on file  . Number of children: Not on file  . Years of education: Not on file  . Highest education level: Not on file  Occupational History  . Not on file  Tobacco Use  . Smoking status: Former    Current packs/day: 0.00    Types: Cigarettes    Quit date: 05/05/2019    Years since quitting: 4.4    Passive exposure: Never  . Smokeless tobacco: Never  Substance and Sexual Activity  . Alcohol use: Yes    Alcohol/week: 36.0 standard drinks of alcohol  . Drug use: No  . Sexual activity: Not on file  Other Topics Concern  . 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 Within the past 12 months, you worried that your food would run out before you got money to buy more: Never true   . Within the past 12 months, the food you bought just didn't last and you didn't  have money to get more: Never true  Transportation Needs: No Transportation Needs (10/28/2023)   Transportation   . In the past 12 months, has lack of reliable transportation kept you from medical appointments, meetings, work or from getting things needed for daily living? : No  Safety: Low Risk  (10/28/2023)   Safety   . How often does anyone, including family and friends, physically hurt you?: Never   . How often does anyone, including family and friends, insult or talk down to you?: Never   . How often does anyone, including family and friends, threaten you with harm?: Never   . How often does anyone, including family and friends, scream or curse at you?: Never  Living Situation: Low Risk  (10/28/2023)   Living Situation   . What is your living situation today?: I have a steady place to live   . Think about the place you live. Do you have problems with any of the following? Choose all that apply:: None/None on this list     Objective: The most current vitals were  Vitals:   11/01/23 1314 11/01/23 1409 11/01/23 1509 11/01/23 1555  BP: 102/84 135/85 107/75 (!) 127/58  Pulse: 93 101 98 98  Resp:  19 16 18   Temp:      SpO2: 99% 100% 98% 98%  Weight:  Height:        Procedures   Additional MDM/ED Course: Briefly, I assumed care of Tristan Smith who is a 78 y.o. male patient who initially presented to the ED with the above history, here today for concern for sepsis picture address, pulmonary, infectious disease on board, this patient was signed out by the previous provider, and shortly after was made ready to move.  I did not find any additional medical care for this patient  1. Rash      ED Disposition     ED Disposition  Admit   Condition  --   Comment  Primary Provider Group: Yes [1]  Provider Group:: [facility] ([facility]) [[id number]]  Certification: I certify that inpatient services are medically necessary for this patient for a duration of  greater than two midnights. See H&P and MD Progress Notes for additional information about the patient's course of treatment.           The patient was seen, evaluated, and treated in conjunction with the attending physician, who voiced agreement in the care provided.  Note generated using Dragon voice dictation software and may contain dictation errors. Please contact me for any clarification or with any questions.   Electronically signed by:  Fairy Kerby Revere, M.D. ([email or web address])  11/01/2023 2:49 PM       [1] Past Medical History: Diagnosis Date  . Arthritis of right shoulder region 08/18/2019  . Atypical chest pain 11/05/2011   Negative stress ECG for inducible ischemia at target heart rate, normal LV function, EF 55-60%; [facility] Dr. Herold  . Combined forms of age-related cataract of both eyes 05/06/2015  . Coronary artery disease involving native coronary artery of native heart without angina pectoris 07/16/2019  . Currently smokes tobacco 06/24/2015  . GERD (gastroesophageal reflux disease) 11/05/2011  . Hiatal hernia   . Hypertension 2017  . Snores 06/24/2015   but wife denies witnessed apnea and sleep study  [2] Past Surgical History: Procedure Laterality Date  . CARDIAC CATHETERIZATION     Procedure: CARDIAC CATHETERIZATION  . CATARACT EXTRACTION Right 06/27/2015   Procedure: CATARACT EXTRACTION; [facility] Dr. Ryan  . CERVICAL DISCECTOMY Bilateral 03/31/2020   Procedure: POSTERIOR CERVICAL METREX DISCECTOMY MULTI LEVEL  C3-4, 4-5;  Surgeon: Carlin Duos Alvan Raddle., MD;  Location: [facility] MAIN OR;  Service: Neurosurgery;  Laterality: Bilateral;  . COLONOSCOPY     Procedure: COLONOSCOPY  . CORONARY ARTERY BYPASS GRAFT N/A 05/21/2019   Procedure: CABG ON PUMP - CORONARY ARTERY BYPASS GRAFT;  Surgeon: Yancy Patriciaann Herder, MD;  Location[facility] MAIN OR;  Service: Cardiothoracic;  Laterality: N/A;  . OTHER SURGICAL HISTORY Right 09/07/2015   Procedure: OTHER SURGICAL HISTORY ([facility])  . SAPHENOUS  VEIN GRAFT RESECTION Left 05/21/2019   Procedure: SAPHENOUS VEIN HARVEST;  Surgeon: Yancy Patriciaann Herder, MD;  Location: [facility] MAIN OR;  Service: Cardiothoracic;  Laterality: Left;  [3] Family History Problem Relation Name Age of Onset  . Cataracts Brother    . Hypertension Brother    . Osteoarthritis Mother    . Stroke Father    . Rheum arthritis Neg Hx    . Lupus Neg Hx    . Inflammatory bowel disease Neg Hx    . Psoriasis Neg Hx    . Anesthesia problems Neg Hx

## 2023-11-01 NOTE — H&P (Signed)
 Gerontology (ACE Unit) History and Physical  Subjective: Chief Complaint: Rash, fever History obtained from: Patient, patient's wife Tristan Smith) History of Present Illness: Tristan Smith is a 78 y.o. male with COPD, HFpEF, CAD s/p CABG (~2021), HTN, GERD, spinal degenerative disc disease, and former smoker presenting from a SNF with a rash and episode of fever (101.3). About a week ago (7/24) he developed a rash on his lower back. Then spread to his hips, torso and extremities. Now it covers all areas of his body including his palms. Rash is non painful. It was initially itchy but the itchiness has resolved with benadryl and topical steroids. He dose not feel like the rash has significantly improved since stopping vancomycin . He endorses intermittent nausea, 1 episode of vomiting, and diarrhea for 3 days. States he has been having about 3 loose stools per day despite imodium. Has also had some swelling and pain in his right elbow with overlying blisters that started yesterday. It has been getting worse. He started having fevers this morning. No fevers reported at his facility. He started supplemental O2 in the ED. No O2 requirement at baseline although he was discharged with O2 from recent hospitalization but was weaned off at his SNF. Has increased weakness/fatigue. Decreased oral intake but has still been drinking protein shakes. Denies abd pain, dysuria, dark urine, fevers/chills, chest pain, SOB, cough.  Uses a wheelchair and walker at baseline. Prior to abscess he did not require ambulatory support devises.  Outside Medical Records (reviewed and summarized):  -Patient staying at [facility].  Per notes, his rash started the evening of 7/24.  Today his rash appeared vancomycin  levels were collected and they were >26.  He was started on Zyrtec, Benadryl, triamcinolone  on 7/28.  Seen by ID on 7/28 who felt rash may be related to vancomycin  and he was transition to daptomycin which was started on  7/29.   - Had 18-day hospital admission in June during which he was managed for epidural abscess and lumbar osteomyelitis, right olecranon bursitis, and heart failure/COPD exacerbation.  Presented with worsening back pain, found to have paraspinal abscess/discitis on MRI spine.  Initially on vancomycin  and cefepime.  Seen by infectious disease who recommended IV vancomycin  for 6-8 weeks.  He was discharged with a PICC line.  ED Course (reviewed and summarized):  Vitals: Afebrile, HR 90s, BP 98/71 (MAP 71), on RA Labs:  CBC with normocytic anemia with Hgb 10, leukocytosis (15.2) with neutrophilic predominance.   Creatinine 1.47 (baseline 0.9) albumin 2.3.   Lactic acid 1.9.   CRP elevated at 111.2, ESR elevated at 21. Radiology: CXR with increased patchy opacity in the RLL likely aspiration versus pneumonia with parapneumonic effusion.   CT PE study without PE but showing findings of mild interstitial pulmonary edema with small/moderate right pleural effusion, subtle multifocal central centrilobular nodules/tree-in-bud opacities.   CT abdomen with contrast with prevertebral abscess at the right eccentric aspect of L3/L4 in setting of known osteomyelitis/discitis, adjacent right sided psoas myositis, moderate right-sided pleural effusion with partial collapse of RLL. Interventions: LR bolus 1 L, cefepime, infectious disease and dermatology consulted  Medical History[1] Social History   Socioeconomic History  . Marital status: Married    Spouse name: Not on file  . Number of children: Not on file  . Years of education: Not on file  . Highest education level: Not on file  Occupational History  . Not on file  Tobacco Use  . Smoking status: Former    Current  packs/day: 0.00    Types: Cigarettes    Quit date: 05/05/2019    Years since quitting: 4.4    Passive exposure: Never  . Smokeless tobacco: Never  Substance and Sexual Activity  . Alcohol use: Yes    Alcohol/week: 36.0 standard drinks  of alcohol  . Drug use: No  . Sexual activity: Not on file  Other Topics Concern  . 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 Within the past 12 months, you worried that your food would run out before you got money to buy more: Never true   . Within the past 12 months, the food you bought just didn't last and you didn't have money to get more: Never true  Transportation Needs: No Transportation Needs (11/01/2023)   Transportation   . In the past 12 months, has lack of reliable transportation kept you from medical appointments, meetings, work or from getting things needed for daily living? : No  Safety: Low Risk  (11/01/2023)   Safety   . How often does anyone, including family and friends, physically hurt you?: Never   . How often does anyone, including family and friends, insult or talk down to you?: Never   . How often does anyone, including family and friends, threaten you with harm?: Never   . How often does anyone, including family and friends, scream or curse at you?: Never  Living Situation: Low Risk  (11/01/2023)   Living Situation   . What is your living situation today?: I have a steady place to live   . Think about the place you live. Do you have problems with any of the following? Choose all that apply:: None/None on this list   Family History[2]      OBJECTIVE: Physical Exam: Vital Signs: Vitals:   11/01/23 1755  BP: (!) 117/59  Pulse: 99  Resp: 18  Temp: (!) 100.6 F (38.1 C)  SpO2: 95%    General:  NAD Eyes: EOMI, no scleral icterus ENT: no oral lesions, OP clear, moist mucus membranes Cardiovascular:  Regular rhythm, mild tachycardia, no murmurs Respiratory: Diminished right lower lung sounds, otherwise no wheezes or crackles appreciated Abdomen:  Soft, nondistended, nontender, normoactive bowel sounds Skin/MSK:  Diffuse erythematous maculopapular rash present back, trunk, arms, legs, palms of hands but not soles of feet, 1 bullae present at RUE, 1+ pitting of edema at b/l LE, RLE>LLE, RUE>LUE, no tenderness to palpation at RLE Neuro:  alert and oriented x 3, normal speech, 5/5 strength at bilateral LE Psych: appropriate judgment   CBD:  Results from last 7 days  Lab Units 11/01/23 1209 11/01/23 0825 10/30/23 0600  WHITE BLOOD CELL COUNT 10*3/uL 14.30* 15.20* 5.20  RED BLOOD CELL COUNT 10*6/uL 3.23* 3.30* 3.57*  HEMOGLOBIN g/dL 9.9* 89.9* 89.0*  HEMATOCRIT % 29.8* 30.7* 33.4*  MEAN CORPUSCULAR VOLUME fL 92.1 93.1 93.7  MEAN CORPUSCULAR HEMOGLOBIN pg 30.6 30.2 30.7  MEAN CORPUSCULAR HEMOGLOBIN CONC g/dL 66.7 67.5* 67.1*  RED CELL DISTRIBUTION WIDTH % 15.0 15.2 14.9  MEAN PLATELET VOLUME fL 7.9 7.7 7.6  PLATELET COUNT 10*3/uL 314 336 178  LYMPHOCYTES RELATIVE PERCENT % 4 5 8   NEUTROPHILS RELATIVE PERCENT % 73 66 53  MONOCYTES RELATIVE PERCENT % 7 8 17   EOSINOPHILS RELATIVE PERCENT % 15 20 22   BASOPHILS RELATIVE PERCENT %  0 0 0  NRBC ABS 10*3/uL 0.00 0.00 0.00  NEUTROPHILS ABSOLUTE COUNT 10*3/uL 10.50* 10.10* 2.80  LYMPHOCYTES ABSOLUTE COUNT 10*3/uL 0.60* 0.80* 0.40*  MONOCYTES ABSOLUTE COUNT 10*3/uL 1.00* 1.20* 0.90*  EOSINOPHILS ABSOLUTE COUNT 10*3/uL 2.20* 3.10* 1.10*  BASOPHILS ABSOLUTE COUNT 10*3/uL 0.00 0.10 0.00   CMP:  Results from last 7 days  Lab Units 11/01/23 1209 11/01/23 0825 10/30/23 1455 10/30/23 0600 10/27/23 1745  SODIUM mmol/L 137 139 138 138 138  POTASSIUM mmol/L 3.8 3.7 3.6 2.9* 3.3*  CHLORIDE mmol/L 100 101 101 101 100  CO2 mmol/L 26 28 26 29 29   BUN mg/dL 38* 35* 30* 28* 31*  CREATININE mg/dL 8.52* 8.56* 8.71 8.83 9.02  CALCIUM mg/dL 6.3* 6.7* 6.5* 6.4* 6.7*  BILIRUBIN TOTAL mg/dL 0.4 0.4  --  0.3 0.3  AST U/L 14 15  --  16 23  ALT U/L 15 16  --  16 22  TOTAL PROTEIN g/dL 4.7* 4.7*  --  4.4* 5.0*  ALBUMIN g/dL 2.3* 2.4*  --  2.2* 2.5*  ANION GAP mmol/L  11 10 11 8 9     ASSESSMENT/PLAN: Tristan Smith is a 78 y.o. male with COPD, HFpEF, CAD s/p CABG (~2021), HTN, GERD, spinal degenerative disc disease, and former smoker presenting from a SNF with a rash and episode of fever (101.3).  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  Rash seems to have started on a day when vancomycin  levels were collected and found to be elevated (>26).  He was then switched to daptomycin however the rash progressively worsened.  Also associated with fevers, nausea, diarrhea and found to have an AKI.  Patient's presentation is concerning for drug reaction, dress syndrome. PLAN: - Dermatology consulted  - Triamcinolone  0.1% cream - Infectious disease consulted - Cefepime started in the emergency department, plan to continue cefepime 1 g every 8 hours - Linezolid 600 mg twice daily per ID recs   # Lumbar discitis and paraspinal abscesses - Patient with recent hospitalization from 6/11 through 6/29 during which he presented with severe back pain and was found to have lumbar discitis and paraspinal abscess with cultures positive for MRSA.  Potential etiologies for abscesses recent dental procedure, recent epidural injection.  He was discharged with a plan for 6 to 8 weeks of vancomycin .  Vancomycin  was discontinued due to progressively worsening rash and was transitioned to daptomycin.  Tentative EOT was 11/13/2023 pending repeat imaging. PLAN: - Infectious disease consulted - Cefepime 1 g every 8 hours - Linezolid 600 mg twice daily - Consider repeat imaging (MRI) - Tylenol for fevers as needed   #Acute Kidney Injury -Cr elevated at 1.47. Baseline around 0.9 -DDx: prerenal (decreased PO intake), intrinsic, or post-renal - Potential etiologies include prerenal (decreased p.o. intake), dress  syndrome, hypotension iso infection/sepsis PLAN: -Daily BMP, Mg, and PO4 - UA with mirco - Urine Na and Cr  - daily weights, strict I/Os - avoid nephrotoxic medications (ACEI, NSAIDS, diuretics)  - renally dose medications  - S/p 1 L LR bolus - Consider renal ultrasound if worsening   # R olecranon bursitis - Patient with right olecranon bursitis on recent hospitalization in June.  On exam, right elbow is warm to touch and somewhat tender. PLAN: - RUE x-ray - Consider orthopedic surgery consult for joint aspiration   Chronic Medical Problems: #CAD s/p CABG: Continue home aspirin  and #HFpEF: Continue Coreg 3.125 mg twice daily.  Lasix 20 mg daily held iso soft BP and AKI #HLD: Continue home statin #COPD: LABA/LAMA/ICS nebs twice daily #Spinal DDD: Continue home duloxetine 20 mg daily #HTN: HCTZ 25 mg daily losartan 50 mg daily held isosoft blood pressures and AKI #GERD: Continue home PPI  Hospital Checklist #DVT PPX: Heparin SubQ  #FEN: Adult Diet- Regular #Discharge Planning: #Ethics: Full Code    Electronically signed by Otha Lavada Blanch, MD 11/01/2023 6:32 PM  ATTENDING PHYSICIAN ATTESTATION:  I have seen and examined Tristan Smith today in association with Dr. Blanch.  We have discussed Tristan Smith's history, exam, assessment and plan.  I have reviewed and am in agreement with the note above, with my following key additions.  I saw Tristan Smith in the ED today with Dr. Blanch and Dr. Mercer from our team.  I have also reviewed his recent notes from our Geriatrics group at [place].  Tristan Smith wife shows pictures from her phone that has demonstrated the progression of the rash that Tristan Smith has had that started at his right thigh/buttocks region (more red/confluent) and his back (more fine in appearance and almost like a viral exanthem appearance on her initial pictures from late last week/this past weekend.  However, his rash has progressed throughout his  body and has now stated to show changes on his hands and arms.  She also notes an increase in his right forearm size over the past 48 hours.  Tristan Smith antibiotics were changed from Vanc to Dapto earlier this week by ID given a supratherapeutic Vanc level and concern that it could have been playing a role in his rash.    On review of labs, Tristan Smith has had worsening of his renal function this week to a value of a creatinine of 1.47 today.  His WBC now has a leukocytosis today at 14.3, and his absolute eosinophil count has doubled from earlier this week (from 1100 to 2200).  On exam, he is febrile (was 100.32F when I checked his oral temp in the ED).  He looks a bit tired but actually much better than I expected from a standpoint of interactivity given his chart review and labs.  He has a grade II/VI systolic murmur that he reports has been known for is adult life.  He had no increased work of breathing.  His rash is as pictured in the chart.  His left arm PICC line does not demonstrate signs of erythema or exudate. He also had no significant tenderness to direct palpation over the psoas or when lifting his right upper thigh against resistance of the psoas.  For now, Tristan Smith has been transitioned from Dapto to Cefepime and Linezolid per report of ID recommendations (formal note from ID not in chart to review).  Dermatology is following as well with topical triamcinolone  and wet wraps.  I do share the concern for potential DRESS syndrome given his rash, eosinophilia, AKI and overall hemodynamics.  Alternate explanations need to remain on the table to include drug eruption or diffuse viral exanthem.  Prior supratherapeutic Vanc level plus volume contraction from his recent GI symptoms could have contributed to his AKI.  We will follow-up on blood cultures and urine culture from today while we have broadened his antibiotics as above.  His current Temp of >100.4 F in the setting of a BP of  93/50 and end-organ changes such as AKI would meet criteria for sepsis syndrome at present.  Will need to maintain a low threshold for transfer to a higher level of care if worsening of his relative hypotension occurs this evening.  For now, will continue with IV fluid support of his blood pressure cautiously while acknowledging the pulmonary volume overload noted on his CT scan from today.  Electronically signed by: Johnie Delene Music, MD 11/01/2023 9:51 PM         [1] Past Medical History: Diagnosis Date  . Arthritis of right shoulder region 08/18/2019  . Atypical chest pain 11/05/2011   Negative stress ECG for inducible ischemia at target heart rate, normal LV function, EF 55-60%; [facility] Dr. Herold  . Combined forms of age-related cataract of both eyes 05/06/2015  . Coronary artery disease involving native coronary artery of native heart without angina pectoris 07/16/2019  . Currently smokes tobacco 06/24/2015  . GERD (gastroesophageal reflux disease) 11/05/2011  . Hiatal hernia   . Hypertension 2017  . Snores 06/24/2015   but wife denies witnessed apnea and sleep study  [2] Family History Problem Relation Name Age of Onset  . Cataracts Brother    . Hypertension Brother    . Osteoarthritis Mother    . Stroke Father    . Rheum arthritis Neg Hx    . Lupus Neg Hx    . Inflammatory bowel disease Neg Hx    . Psoriasis Neg Hx    . Anesthesia problems Neg Hx

## 2023-11-02 NOTE — Progress Notes (Signed)
 Gerontology (ACE Unit) Progress Note    Assessment/Plan: Delontae Lamm is a 78 y.o. male with PMH of COPD, HFpEF, CAD s/p CABG (~2021), HTN, GERD, spinal degenerative disc disease, and former smoker presenting from a SNF with a rash and episode of fever (101.3).   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  Rash seems to have started on a day when vancomycin  levels were collected and found to be elevated (>26).  He was then switched to daptomycin however the rash progressively worsened.  Also associated with fevers, nausea, diarrhea and found to have an AKI.  Patient's presentation is concerning for drug reaction, dress syndrome. -Haptoglobin slightly elevated at 263.  LDH and bilirubin are WNL making hemolytic anemia unlikely. -- PLAN: - Dermatology consulted              - Triamcinolone  0.1% cream  - Prednisone 75 mg daily - Infectious disease consulted - Cefepime started in the emergency department, plan to continue cefepime 1 g every 8 hours - Linezolid 600 mg twice daily per ID recs   # Lumbar discitis and paraspinal abscesses - Patient with recent hospitalization from 6/11 through 6/29 during which he presented with severe back pain and was found to have lumbar discitis and paraspinal abscess with cultures positive for MRSA.  Potential etiologies for abscesses recent dental procedure, recent epidural injection.  He was discharged with a plan for 6 to 8 weeks of vancomycin .  Vancomycin  was discontinued due to progressively worsening rash and was transitioned to daptomycin.  Tentative EOT was 11/13/2023 pending repeat imaging. -- PLAN: - Infectious disease consulted - Cefepime 1 g every 8 hours - Linezolid 600 mg twice daily - Consider repeat imaging (MRI) - Tylenol for fevers as needed   #Acute Kidney  Injury -Cr elevated at 1.47. Baseline around 0.9 -DDx: prerenal (decreased PO intake), intrinsic, or post-renal - Potential etiologies include prerenal (decreased p.o. intake), dress syndrome, hypotension iso infection/sepsis -Fina 0.2% indicating prerenal etiology -- PLAN: -Daily BMP, Mg, and PO4 - UA with mirco - daily weights, strict I/Os - avoid nephrotoxic medications (ACEI, NSAIDS, diuretics)  - renally dose medications  - Consider renal ultrasound if worsening   # R olecranon bursitis - Patient with right olecranon bursitis on recent hospitalization in June.  On exam, right elbow is warm to touch and somewhat tender. PLAN: - f/u RUE x-ray - Orthopedic surgery consult for joint aspiration  #Electrolyte abnormalities - Magnesium was undetectably low and ionized calcium 0.9. -- PLAN: - Aggressive electrolyte repletion - Repeat CMP, mag this afternoon   Chronic Medical Problems: #CAD s/p CABG: Continue home aspirin and #HFpEF: Hold Coreg 3.125 mg twice daily for hypotension.  Lasix 20 mg daily held iso soft BP and AKI #HLD: Continue home statin #COPD: LABA/LAMA/ICS nebs twice daily #Spinal DDD: Continue home duloxetine 20 mg daily #HTN: HCTZ 25 mg daily losartan 50 mg daily held isosoft blood pressures and AKI #GERD: Continue home PPI   Daily Summary 11/02/2023: Hospital Day 1  Patient found to have undetectably low magnesium with low calcium overnight.  He is being given aggressive electrolyte repletion.  This morning he had low blood pressures with systolics in the 80s.  He is asymptomatic at this time and paradoxically reports improvement in his symptoms overall.  He has been afebrile.  He denied any chest pain, SOB, fever/chills, nausea/vomiting, abdominal pain,  cough, dysuria.  OBJECTIVE: Vital Signs:  Temp:  [98.4 F (36.9 C)-100.8 F (38.2 C)] 98.4 F (36.9 C) Heart Rate:  [79-101] 79 Resp:  [13-24] 16 BP: (93-135)/(50-85) 116/53   Physical Exam   General:  NAD Eyes: EOMI, no scleral icterus ENT: no oral lesions, OP clear, moist mucus membranes Cardiovascular:  Regular rhythm, mild tachycardia, no murmurs Respiratory: Diminished right lower lung sounds, otherwise no wheezes or crackles appreciated Abdomen:  Soft, nondistended, nontender, normoactive bowel sounds Skin/MSK: Diffuse erythematous maculopapular rash present back, trunk, arms, legs, palms of hands but not soles of feet, 1 bullae present at RUE, 1+ pitting of edema at b/l LE, RLE>LLE, RUE>LUE, no tenderness to palpation at RLE Neuro:  alert and oriented x 3, normal speech, 5/5 strength at bilateral LE Psych: appropriate judgment    Labs:  CBC:  Results from last 7 days  Lab Units 11/02/23 0343 11/01/23 1209 11/01/23 0825 10/30/23 0600  WHITE BLOOD CELL COUNT 10*3/uL 15.90* 14.30* 15.20* 5.20  HEMOGLOBIN g/dL 8.7* 9.9* 89.9* 89.0*  HEMATOCRIT % 26.4* 29.8* 30.7* 33.4*  PLATELET COUNT 10*3/uL 276 314 336 178  , DIFF:  Results from last 7 days  Lab Units 11/02/23 0343 11/01/23 1209 11/01/23 0825 10/30/23 0600 10/27/23 1745  NEUTROPHILS RELATIVE PERCENT % 69 73 66 53 63  LYMPHOCYTES RELATIVE PERCENT % 5 4 5 8 10   MONOCYTES RELATIVE PERCENT % 11 7 8 17 16   BASOPHILS RELATIVE PERCENT % 0 0 0 0 0  EOSINOPHILS RELATIVE PERCENT % 15 15 20 22 11   NRBC % % 0 0 0 0 0  NEUTROPHILS ABSOLUTE COUNT 10*3/uL 10.90* 10.50* 10.10* 2.80 1.80  LYMPHOCYTES ABSOLUTE COUNT 10*3/uL 0.80* 0.60* 0.80* 0.40* 0.30*  MONOCYTES ABSOLUTE COUNT 10*3/uL 1.70* 1.00* 1.20* 0.90* 0.50  BASOPHILS ABSOLUTE COUNT 10*3/uL 0.00 0.00 0.10 0.00 0.00  EOSINOPHILS ABSOLUTE COUNT 10*3/uL 2.40* 2.20* 3.10* 1.10* 0.30  , CMP:   Results from last 7 days  Lab Units 11/02/23 0343 11/01/23 1209 11/01/23 0825 10/30/23 1455 10/30/23 0600 10/27/23 1745  SODIUM mmol/L 133* 137 139 138 138 138  POTASSIUM mmol/L 3.6 3.8 3.7 3.6 2.9* 3.3*  CHLORIDE mmol/L 99 100 101 101 101 100  CO2 mmol/L 22 26 28 26 29  29   BUN mg/dL 45* 38* 35* 30* 28* 31*  CREATININE mg/dL 8.00* 8.52* 8.56* 8.71 1.16 0.97  CALCIUM mg/dL 5.8* 6.3* 6.7* 6.5* 6.4* 6.7*  MAGNESIUM mg/dL <9.1*  --   --   --   --   --   PHOSPHORUS mg/dL 5.1*  --   --   --   --   --   BILIRUBIN TOTAL mg/dL  --  0.4 0.4  --  0.3 0.3  AST U/L  --  14 15  --  16 23  ALT U/L  --  15 16  --  16 22  TOTAL PROTEIN g/dL  --  4.7* 4.7*  --  4.4* 5.0*  ALBUMIN g/dL  --  2.3* 2.4*  --  2.2* 2.5*  ANION GAP mmol/L 12 11 10 11 8 9   , Coags:   Results from last 7 days  Lab Units 11/01/23 1209  INR  1.2  , Cardiac:  No results for input(s): CK, CKMB, BNP in the last 72 hours.  Invalid input(s): TROPONINI, CKMBP, Glycemic control:    and Blood Gas:     Invalid input(s): BDF, O2S   Radiology:     CT Abdomen Pelvis W Contrast  Final Result by  Bonna Stallion, MD (08/01 1511)  CT ABDOMEN PELVIS W CONTRAST, 11/01/2023 2:12 PM    INDICATION: Sepsis   COMPARISON: Outside MRI L-spine 09/16/2023, images not available    TECHNIQUE: CT images of the abdomen and pelvis were obtained after   intravenous administration of iodinated contrast. Conventional axial   reconstructions and multiplanar reformatted images were submitted for   review.     FINDINGS:    . Lower Chest: Moderate right-sided pleural effusion with adjacent partial   collapse of the right lower lobe. Prior CABG and median sternotomy,   partially visualized.    . Liver: No suspicious focal findings.  . Gallbladder/Biliary: Unremarkable.  SABRA Spleen: Unremarkable.  . Pancreas: Unremarkable.  . Adrenals: Unremarkable.  . Kidneys: Unremarkable.    . Peritoneum/Mesenteries/Extraperitoneum: No free air. No free fluid. No   pathologically enlarged lymph nodes.  . Gastrointestinal tract: No evidence of obstruction. Duodenal   diverticulum.    . Ureters: Unremarkable.  . Bladder: Unremarkable.  . Reproductive System: Unremarkable.    . Vascular: Extensive aortobiliac  calcifications. No aortic aneurysm.  . Musculoskeletal: Irregular contour of the inferior endplate of L3 and   superior endplate of L4. Anterolateral to the right eccentric portion of   this L3-L4 disc space is a peripherally enhancing centrally   hypoattenuating 1.9 x 3.1 cm fluid collection. Mild stranding adjacent to   the right psoas muscle adjacent at the level of the fluid collection.   Anasarca. No acute displaced fractures. Polyarticular degenerative   changes. S-shaped scoliosis of the thoracolumbar spine.      IMPRESSION:  1.  Sequela of discitis-osteomyelitis with prevertebral abscess at the   level of the L3-L4 disc space.   2.  Possible right-sided psoas myositis. For more sensitive evaluation   refer to previous outside MRI L-spine dated 09/16/2023.  3.  Moderate right-sided pleural effusion with adjacent partial collapse   of the right lower lobe.    CT Angio Chest Pulmonary Embolism  Final Result by Eulas Carlota Dauer, MD (08/01 1616)  CT ANGIO CHEST PULMONARY EMBOLISM, 11/01/2023 2:11 PM    INDICATION: Pulmonary embolism (PE) suspected, unknown D-dimer   COMPARISON: CT chest 07/10/2021    TECHNIQUE: Iodinated contrast was administered intravenously by rapid   injection, with further multislice axial sections acquired in the   pulmonary arterial phase from the thoracic inlet to the upper abdomen.   Coronal maximal intensity projection images were created for comprehensive   analysis and diagnosis of the regional circulation.    All CT scans at [facility] and [facility]   Imaging are performed using radiation dose optimization techniques as   appropriate to a performed exam, including but not limited to one or more   of the following: automatic exposure control, adjustment of the mA and/or   kV according to patient size, use of iterative reconstruction technique.   In addition, our institution participates in a radiation dose  monitoring   program to optimize patient radiation exposure.    FINDINGS:     Pulmonary arteries: The study is somewhat limited by motion respiratory   artifact and poor contrast timing with multiple flow artifact and   nonopacified pulmonary arteries for example in indexed pulmonary arteries   8:210, 363, 387. No central pulmonary emboli identified.    Aorta/great vessels: No aneurysm. Mild thoracic aorta calcification that   extends into the supra aortic branches. Left arm PICC line with tip   terminates in the  superior cavoatrial junction.    Thoracic inlet/central airways: Thyroid normal. Airway patent.    Mediastinum/hila/axilla: Right hilar lymph node measuring 8 mm in short   axis.    Heart: Post op changes of CABG with heavily calcified native coronary   arteries. Normal heart size. No pericardial effusion.    Lungs/pleura: Moderate right pleural effusion with adjacent compressive   atelectasis. Prominent motion respiratory artifact limiting detailed   evaluation of lung parenchyma. Subtle multifocal centrilobular   nodules/tree-in-bud opacity seen in 5 pulmonary lobes predominantly in the   upper lobe. Similar left lower lobe 4 mm nodule (7:230).    Upper abdomen: Visualized upper abdomen is within normal limits.    Chest wall/MSK: Chest wall anasarca. Multilevel degenerative changes of   the spine and bilateral shoulder. Intact and well aligned midline   sternotomy wires. No acute fracture or suspicious osseous lesion.    IMPRESSION:  *  Limited exam. No central pulmonary embolus or right heart strain. If   clinical suspicion remains, Doppler ultrasound of the lower extremities   may be considered for further assessment.  *  Subtle multifocal centrilobular nodules/tree-in-bud opacities   bilaterally and diffuse bronchial wall thickening, may represent   aspiration  *  Small to moderate right pleural effusion with adjacent compressive   atelectasis.    XR Chest 1  View  Final Result by Amada Hildy Carwin, MD (08/01 1356)  XR CHEST 1 VIEW, 11/01/2023 12:26 PM    INDICATION:fever   COMPARISON: Chest x-ray 10/21/2023    FINDINGS:     Supportive devices: Similar positioning of left upper extremity PICC.  Cardiovascular/lungs/pleura: Postsurgical appearance of the   cardiomediastinal silhouette status post CABG. Wedge-shaped confluent   opacity silhouetting the right diaphragm which appears more conspicuous   compared to prior. Adjacent hazy/patchy opacities in the right lung base.   Mild interstitial prominence in the right greater than left lungs.  Other: Possible fracture of the inferior most median sternotomy wire, not   well evaluated due to technique. Degenerative changes.    IMPRESSION:  Increased patchy opacity in the right lower lung, likely representing   aspiration/pneumonia with parapneumonic effusion.         Hospital Checklist:  #Prophylaxis:  - DVT Ppx:  - Stress Ulcer:   #FEN:  - Adult Diet- Regular  #Ethics:  - Code Status: Full Code  #Discharge Planning:  - Dispo: - PT: - OT: - SLP: - Outpatient Followup Items:    This document was created using the aid of voice recognition Dragon dictation software, please excuse any sound alike substitutions, typographic, or transcription errors. Efforts have been made to correct these errors, however, some may persist, and this does not reflect the standard of medical care. If there are any questions please do not hesitate to contact me for clarification   Electronically signed by: Manus Raford Furnace, MD 11/02/2023 7:59 AM  ATTENDING PHYSICIAN ATTESTATION:  I have seen and examined Mr. Mathers today in association with Dr. Furnace.  We have discussed Mr. Rickey's history, exam, assessment and plan.  I have reviewed and am in agreement with the note above, with my following key additions.  Mr. Kolar continues to look better than his numbers in his chart.  He was a bit more tired  this morning than yesterday, though this afternoon his energy was better.  Mr. Perry continues to describe some diffuse pruritus.  He reports that his urine overnight was a little more concentrated - more of an  amber yellow but not bloody or tea colored in nature.  He had not urinated yet this morning on our rounds, though his RN this afternoon reported that he had urine in his brief while changing him after a bowel movement.  Mr. Pardo denied dyspnea or bleeding.  He did report feeling cold.  In addition, both he and his wife felt that his rash was a bit more extensive - primarily in the palmar regions (left more than right).  On exam, Mr. Cutler continues to have a fairly confluent rash at his flanks and chest (a bit more prominently erythematous on the right than the left).  His PICC line site has no sign of exudate or erythema.  His abdomen was without tenderness to palpation as well.  Las revealed a continued uptrend in his creatinine to 1.99.  Given this, IV fluids were restarted.  Dr. Mercer contacted dermatology now that ID has stated that steroids are OK from their end.  Dermatology has recommended that we start prednisone at the 1mg /kg daily dosing.  We have done this and added back his PPI for gastric protection.  We will need to be cognizant that his BUN and overall WBC count may increase on high-dose steroids with de-margination, though his creatinine should not be negatively affected by it (and I am hopeful that steroids will improve his AKI if there is a component of DRESS leading to his acute renal failure).    Mr. Chaires absolute eosinophil count remains elevated today at 2400 but has at least decreased from a peak of 3100.  We check hemolysis/DIC labs, and Mr. Bardin's normal fibrinogen is reassuring, as is his normal LDH in the setting of a slightly low haptoglobin.  Will keep an eye on Mr. Cozby Hgb as well - there may a dilutional component to the drop in his  hemoglobin, but there has been no visible bleeding.  I am adding a ferritin to his afternoon labs to continue to build evidence that this indeed is DRESS so as not to anchor on one diagnosis.  Reassuringly, both Mr. Blanda RUE and RLE dopplers were negative for DVT.  For now, we are continuing IV fluids for his AKI though cautiously monitoring Mr. Sage's fluid status for any signs of pulmonary fluid overload.  If his creatinine worsens overnight into tomorrow after further IV fluids and after initiation of steroids today, then we will engage Nephrology in consultation.  Mr. Dunlap blood cultures and urine cultures are currently negative for one day.  His C. Diff toxin and GIP tests of his stools were also negative.  We will follow-up on further ID recommendations at present.  Mr. Wadding wife was present for my multiple visits throughout the day and was updated on labs and imaging results as they returned.    Mr. Hendricksen continues to warrant close monitoring given that he still has a somewhat high potential for need for a higher level of care - particularly if his blood pressures show downtrending but his MAPs improved throughout the day, which I am hopeful is a trend toward sustained improvement.  Electronically signed by: Johnie Delene Music, MD 11/02/2023 7:42 PM

## 2023-11-11 NOTE — Telephone Encounter (Signed)
 Order placed -- thanks for your help on this!

## 2023-11-11 NOTE — Telephone Encounter (Signed)
 Call placed to [facility] Radiology, they do not see the order yet, they provided a fax number, [phone], order faxed as requested.

## 2023-11-11 NOTE — Telephone Encounter (Signed)
 Triage:  > Please call [facility] radiology to request power share of MRI lumbar spine DOS 09/16/23 to allow [facility] radiology to obtain in PACS system for an over-read > Once imaging shared, please call our imaging library at [phone] to ensure in PACS and link to MRI outside images resultable order in chart

## 2023-11-11 NOTE — Telephone Encounter (Signed)
 Call placed to [facility] Radiology [phone], spoke with Iva, she states that the provider would have to place a request for images in PowerShare. MRI they have is from September 16, 2023.  Call placed to [facility] Radiology, spoke with film library. Requires an order that states MRI Outside Images Non-Result.  Once we receive that order we should be able to call [facility] and have them Powershare the image.   Message to provider for new non-result order.

## 2023-11-12 NOTE — Progress Notes (Signed)
 Acute Physical Therapy Treatment  Visit Count: PT Visit Count: 3  Precautions: Other Therapy Precautions: Fall risk, Skin Integrity Comment: Monitor BP  Medical Diagnosis/Course: PT Diagnosis/Course PT Medical Dx: RASH Pertinent Medical Course: Tristan Smith is a 78 y.o. male with PMH of COPD, HFpEF, CAD s/p CABG (~2021), HTN, GERD, spinal degenerative disc disease, and former smoker presenting from a SNF with a rash and episode of fever (101.3). per Steva, MD progress note 11/10/23  Assessment and Plan  Assessment: Pt is making slow progress towards all PT goals.  Pt decreased score on [facility] AM-PACT "6 Clicks" Basic Mobility Inpatient Short Form from 20/24 1day ago to 18/24 today. Pt was able to tolerate gait training away from bed but for less distance than yesterday.  Pt did attempt but was unable to ascend 1 step despite heavy support or rail and pt has minimum of 3 STE w/ only a grab bar to enter his home. Recommend post acute inpt daily PT. Edema, DOE, global weakness ,fatigue, LE weakness  and poor activity tolerance continue to be barriers to mobility but pt is cooperative and willing to work. Feel pt would benefit from ongoing PT services in order to address stated problems, to improve functional status and to prepare for eventual return back to prior living situation.   Problem list: Impairments/Limitations: Mobility deficits, Activity Tolerance Deficits, Muscle weakness, Ambulation Deficits, Transfer deficits, Balance Deficits, Range of Motion deficits   PT Recommendation: During hospital stay: Nursing Mobility Recommendation :+++OOB to chair with nursing  TID+++  PT Recommendation: Rehabilitation Facility (if to home, will need ambulance transport inside , A w/ all mobility and HHPT) PT Equipment Recommended: Walker-rolling, Bedside Commode, Shower Chair  PT recommends:  balance support with mobility, assistance with gait, assistance with IADLs (home  management, meal preparations/cooking, pet care, shopping), assistance with transportation, and identification of safety hazards within environment , entering/exiting home Home Living Environment: Type of Home House  Home Layout One level  Exterior Stairs - number 2 +1  Exterior Stairs - rails    Interior Stairs - number 0  Interior Stairs - Administrator, sports / Chiropractor (and tub/shower)  Runner, broadcasting/film/video, Regular toilet height  Landscape architect  (no DME at home)  Equipment Currently Using    Additional Comments pt has been in hospital or STR for >1 month; information is from prior to first admission   Prior Level of Function: Level of Independence Independent  Lives With [employer]) able to assist at d/c wife  Patient Responsibilities Manage Finances, Manage Medications, Meal Preparation, Personal ADLs, Driving, Home management, Social Participation, Shopping  Requires Assist With     PT Goals:  Patient and Family Goals: to return home Treatment Plan/Goals Established with Patient/Caregiver: Yes  Encounter Problems     Encounter Problems (Active)     Physical Therapy     Patient will go supine to/from sit Independently     Start:  11/06/23    Expected End:  11/27/23      8/12:N/A .ko      Patient will perform sit-to-stand with distant supervision using LRAD     Start:  11/06/23    Expected End:  11/27/23      8/12:CGA and increased time.SABRAko      Patient will ambulate at least 100 feet with distant supervision using LRAD     Start:  11/06/23    Expected End:  11/27/23  8/12:50 x 2 w/ seated rest breaks between trials, Rw .ko      Patient will go up/down 2 +1 step of stairs with distant supervision using LRAD     Start:  11/06/23    Expected End:  11/27/23      8/12:attempted but pt unable.ko      Patient/caregiver will be able to verbalize 3 fall prevention strategies to decrease risk of falls during  functional mobility     Start:  11/06/23    Expected End:  11/27/23      8/12:has only gotten up w/ A .ko      ampac mobility score to improve from 17 to at least 23/24 showing increase independence in functional mobility with decrease risk for falls      Start:  11/06/23    Expected End:  11/27/23      8/12:18/24.ko          Rehab Potential:  Rehab Potential: Good Complexity/Co-morbidities that Impact POC: Cognitive/Memory deficits, Reduced activity level, Severity of condition Impairments/Limitations: Mobility deficits, Activity Tolerance Deficits, Muscle weakness, Ambulation Deficits, Transfer deficits, Balance Deficits, Range of Motion deficits  Plan:  Patient's Prior Level of Function Independent Independent / Mod I (AMPAC 22-24)     (3pts)   Patient's Current Level of Function (18) Needs a Little Help (AMPAC 12-21)    (2pts)   Likelihood for functional decline without therapy .  Fairly Likely    (2pts)  Likelihood  to make significant functional gains with therapy. Highly Likely   (3pts)  Acute therapy likely to change discharge disposition. Fairly Likely    (2pts)   Recommended Frequency: 3-4 times a week (8-12 points total)  PT Frequency: 4x week  Recommend Duration:  For 3 weeks  Recommend Interventions: Patient/Caregiver education, Therapeutic activity, Therapeutic exercise, Gait/mobility training   SUBJECTIVE  Interdisciplinary Team Communication Communication: Patient, Family/Caregiver, Nursing, MD, Clinical Case Management Communication Details: RN/Pt/wife consented to session. Rn updated post session. PT - mobility status/dc recs   General Family/Caregiver Present: wife after session  Subjective:  my legs are so weak   Pain: Pain Assessment Pain Assessment: No/denies pain   OBJECTIVE  Cognition Following Commands: Requires repetition, Requires increased time, Follows only one step commands Comments: Follows all commands and has good insight  into deficits Vitals:   11/12/23 1359  BP: (!) 181/81  Pulse: 82  Resp: 18  Temp: 98.8 F (37.1 C)  SpO2: 99%    Skin Integrity and Edema:  Skin Integrity & Edema Exceptions: Generalized rash, skin peeling, and skin tears scattered over BUEs/LEs and face- imporved from 1 d ago Edema: 2+ Easily identified depression (EID) (skin rebounds to original contour in < 15 sec) Part of Body: B LEs   Interventions: Balance:  Sitting - Static: Independent Sitting - Dynamic: Distant supervision Standing - Static: Close supervision, Verbal Cues, Tactile cues, Visual Cues Standing - Dynamic: Contact Guard Assistance, Visual Cues, Tactile cues, Verbal Cues Balance Additional Comments:   standing: moderate to heavy B UE support for optimal stability with all stance; no overt LOB with Rw       Functional Mobility/Therapeutic Activity :   Seated Scoot Seated Scoot:  Anterior to EOB and posterior in chair Seated Scoot Assist Level: Verbal cues;Tactile cues;SBA Intervention Details: inc time Sit to Stand Sit to Stand Assist Level: Verbal cues;Tactile cues;SB- CGA Intervention Details: from chair x 1 and w/c x 2 with cues for hand placement and for technique ; requires heavy B UE  support   Stand to Sit Stand to Sit Assist Level: Verbal cues;Tactile cues;CG-SBA Intervention Details: to chair x1 and w/c x 2 with cues for tech, hand placement ; fair eccentric control ; requires heavy B UE support     Increased time required to complete all mobility   Gait training:  Gait Deviation: Cadence- Decreased, Decreased Gait Speed, Decreased stride length -Left, Decreased stride length- Right, Forward Flexion (velocity: .25 to .16m/s)  Gait Distance (ft): 50 ft x 2 w/ seated rest between trials Gait Additional Comments: w/brief cues for proper tech , sequencing and for positioning in Rw/upright posture, forward gaze as well as for increasing floor clearance and stride Gait Assistance: Pension scheme manager, Verbal Cues, Tactile cues Assistive Device: Walker-rolling  Stairs: Verbal Cues, Tactile cues, Visual Cues, Extra time, Contact Guard Assistance Handrails: One rail R (heavy support; able to elevate 1 LE onto step but unable to elevate self) Pt was unable to ascend 1 step despite heavy support or rail , pt has minimum of 3 STE w/ only a grab bar to enter his home    Skilled PT Treatment Provided /Interventions performed as follows: Gait Training: see Functional Mobility/mobility section Therapeutic Activities: see as described in Functional Mobility/Therapeutic Activity section and  Balance sections Self care/ Home Management/ Education -  PT role and progression,  safety, OOB with A only,benefits of mobility, gradually increasing mobility as tolerated, frequent mobility  and stance bouts,benefits of progressive upright mobility, adverse effects of immobility, safety during functional mobility, activity pacing/progression, plan of care, d/c recs, post acute PT , HEP (  Instructed in / performed : B ankle pumps , heel slides( in supine )  and hip abd/add ( in recliner)  Per AROM x 10-20 reps each to be performed q waking hour)     Response to all interventions: fatigued, improved mood    Interventions included the following skilled elements:  Education on proper use of assistive device/adaptive equipment Education on home program to improve safety and performance of functional tasks Education on precautions for safe performance of functional tasks Education on safe/proper technique Facilitation for proper positioning/movement in preparation for functional tasks Monitoring exercise/activity tolerance Monitoring safe progression of exercise/activity Tactile cues for correction of atypical postures or movements Tactile cues for proper technique Tactile cues for safe execution of functional tasks Verbal cues for proper technique Verbal cues for safe execution of functional tasks        Outcomes AM-PAC - Basic Mobility:    Flowsheet Row Most Recent Value  AM-PAC 6-Clicks - Basic Mobility  Turning from you back to your side while in a flat bed without using bed rails? None  Moving from lying on your back to sitting on the side of a flat bed without using bed rails? None  Moving to and from a bed to a chair (including a wheelchair)? A little  Standing up from a chair using your arms (e.g, wheelchair, or bedside chair)? A little  To walk in a hospital room? A little  Climbing 3-5 steps with a railing? Total Assist  AM-PAC Total Score 18    Condition After Therapy:  Up in chair, Nursing notified of condition, All needs within reach, Fall interventions in place, Family present in room, Lines intact, Heels floated (LEs elevated)  Treatment Times Time In: 1332 Time Out: 1356 Time in Timed codes: 24 Total Treatment Time: 24     Treatment/Procedures Time Entry Gait/Mobility minutes: 10 Self Care/Home Management (ADLs) minutes: 4  Therapeutic Activity minutes: 10   Charges           11/12/2023   Code Description Service Provider Modifiers Quantity  [id number] Hc Pt Gait Training Therapy Charleen Mule, PT GP 1  [id number] Hc Pt Therapeut Actvity Direct Pt Contact Each 15 Min Charleen Mule, PT GP 1        Time of Service Note Type Status  1358 Progress Notes Signed

## 2023-11-12 NOTE — Care Plan (Signed)
  Problem: Health Behavior: Goal: MCB Ability to state ways to decrease the risk of falls will be met by discharge Description: Ability to state ways to decrease the risk of falls will improve by discharge Outcome: Progressing   Problem: Safety: Goal: Will remain free from falls by discharge Description: Will remain free from falls by discharge Outcome: Progressing   Problem: Knowledge Deficit Goal: Patient/family/caregiver demonstrates understanding of disease process, treatment plan, medications, and discharge instructions Description: Complete learning assessment and assess knowledge base. Outcome: Progressing   Problem: Compromised Skin Integrity Goal: Skin integrity is maintained or improved Description: Assess and monitor skin integrity. Identify patients at risk for skin breakdown on admission and per policy. Collaborate with interdisciplinary team and initiate plans and interventions as needed.    Outcome: Progressing Goal: Fluid and electrolyte balance are achieved/maintained Description: Assess and monitor vital signs (orthostatic vitals if applicable), fluid intake and output, urine color, labs, skin turgor, mucous membranes, jugular venous distention, edema, circumference of edematous extremities and abdominal girth, respiratory status, and mental status.  Monitor for signs and symptoms of hypovolemia (tachycardia, rapid breathing, decreased urine output, postural hypotension, confusion, syncope).  Monitor for signs and symptoms of hypervolemia (strong rapid pulse, shortness of breath, difficulty breathing lying down, crackles heard in lung fields, edema). Collaborate with interdisciplinary team and initiate plan and interventions as ordered. Outcome: Progressing Goal: Nutritional status is improving Description: Monitor and assess patient for malnutrition (ex- brittle hair, bruises, dry skin, pale skin and conjunctiva, muscle wasting, smooth red tongue, and disorientation).  Collaborate with interdisciplinary team and initiate plan and interventions as ordered.  Monitor patient's weight and dietary intake as ordered or per policy. Utilize nutrition screening tool and intervene per policy. Determine patient's food preferences and provide high-protein, high-caloric foods as appropriate.  Outcome: Progressing   Problem: Coping: Goal: Ability to verbalize feelings will improve by discharge Outcome: Progressing Goal: Ability to verbalize ways to enhance comfort will improve by discharge Outcome: Progressing

## 2023-11-12 NOTE — Progress Notes (Signed)
 ------------------------------------------------------------------------------- Attestation signed by Silvano Cy Leaven, MD at 11/12/2023  8:32 PM  I performed the service or was physically present during the critical or key portions of the service furnished by me; and he/she managed the patient.   Silvano Cy Leaven, MD 11/12/2023  -------------------------------------------------------------------------------  Gerontology (ACE Unit) Progress Note   Assessment/Plan: Tristan Smith is a 78 y.o. male with PMH of COPD, HFpEF, CAD s/p CABG (~2021), HTN, GERD, spinal degenerative disc disease, and former smoker presenting from a SNF with a rash and episode of fever (101.3).   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  Rash seems to have started on a day when vancomycin  levels were collected and found to be elevated (>26).  He was then switched to daptomycin however the rash progressively worsened.  Also associated with fevers, nausea, diarrhea and found to have an AKI.  Patient's presentation is concerning for drug reaction, dress syndrome. -Haptoglobin slightly elevated at 263.  LDH and bilirubin are wnl making hemolytic anemia unlikely. PLAN: - Dermatology consulted in ED             - Triamcinolone  0.1% cream to rash  - Topical hydrocortisone 1% (2.5% not on formulary) twice daily to affected areas on face  - Transition to prednisone 60 mg on 8/9 and taper down by 5mg  every 3 days  - Dermatology plans to follow up 1-2 weeks after discharge to reassess - Infectious disease consulted in ED - Cefepime discontinued 8/4 - Discontinue linezolid and start levofloxacin 750 mig daily and rifampin 350 mg BID - PPI BID   # Lumbar discitis and paraspinal abscesses - Patient with  recent hospitalization from 6/11 through 6/29 during which he presented with severe back pain and was found to have lumbar discitis and paraspinal abscess with cultures positive for MRSA.  Potential etiologies for abscesses recent dental procedure, recent epidural injection.  He was discharged with a plan for 6 to 8 weeks of vancomycin .  Vancomycin  was discontinued due to progressively worsening rash and was transitioned to daptomycin.  Tentative EOT was 11/13/2023 pending repeat imaging. PLAN: - Infectious disease consulted in ED as discussed above - Cefepime 1 g every 8 hours, discontinued 8/4 - Discontinue linezolid and start levofloxacin 750 mig daily and rifampin 350 mg BID (anticipate for another 6-8 weeks, tentatively through 01/02/2024) - Plan for repeat MRI L-spine wwo week of 12/23/2023 - Tylenol for fevers as needed   #Acute Kidney Injury, resolved -Cr elevated at 1.47 no presentation. Baseline around 0.9. - Potential etiologies include prerenal (decreased p.o. intake), DRESS syndrome, hypotension with subsequent possible ATN 2/2 infection and DRESS (with lower MAPs in the morning into the afternoon of 8/2 as well), contrast-induced, AIN/drug-induced PLAN: - Nephrology consulted   - No M spike on SPEP  - Started lasix 40 mg po 8/9 - S/p Foley catheter removal and successful voiding trial    # R olecranon bursitis - Patient with right olecranon bursitis on recent hospitalization in June.  On exam, right elbow is warm to touch and somewhat tender. PLAN: - Ortho consult on 8/2 felt that no fluid aspiration or other interventions were necessary   #Chronic anemia #Eosinophilia - Hgb 8.0 today but not inconsistent with values of 8.0-8.4 at SNF in late July. Reviewing hemoglobin over the past, he has not been greater than 9.0  x 3 years and is usually in the 8's. ID feels that Linezeolid associated anemia would not occur so quickly after starting it so we will not adjust antibiotics at this  time.  - Iron profile suggestive of anemia of chronic disease - Eosinophilia has normalized today.  PLAN: - Daily CBC with diff - Hematology consulted  - No plans for bone marrow biopsy  - Re-engage if cytopenias do not improve with supportive measures and potential acute offenders such as infection/DRESS syndrome - Folic acid 1 mg po daily for marrow support    #HFpEF #HTN -Given that renal function is at baseline, home antihypertensives have been resumed.  Home Coreg was increased from 3.125 mg to 6.25 mg twice daily on 8/6. PLAN: - Coreg 6.25 mg twice daily (increased from home 3.125 mg twice daily) - HCTZ 25 mg daily (home dose) - Losartan 50 mg twice daily (home dose) - Lasix 40 mg daily (increased from home dose of Lasix 20 mg daily)  Chronic Medical Problems: #CAD s/p CABG: Continue home aspirin #HLD: Continue home statin #COPD: LABA/LAMA/ICS nebs twice daily #Spinal DDD: Continue home duloxetine 20 mg daily #GERD: Continue home PPI - especially in the setting of his high-dose steroid use at present.   Daily Summary 11/12/2023: Hospital Day 11  No acute events overnight. Patient resting comfortably. Still has minimal bleeding at face due to rash. States diarrhea is improving.   OBJECTIVE: Vital Signs:  Temp:  [98.1 F (36.7 C)-98.8 F (37.1 C)] 98.8 F (37.1 C) Heart Rate:  [82-84] 82 Resp:  [18] 18 BP: (160-197)/(78-86) 181/81   General:  NAD.   HEENT:  Dry peeling lips, no active bleeding at scalp Cardiovascular: Regular rate and rhythm Respiratory: Normal work of breathing Abdomen:  Soft, non-distended, non-tender, normoactive bowel sounds. Skin/MSK: Diffuse erythematous maculopapular rash present back, trunk, arms, legs, palms of hands but not soles of feet.  Rash at back and abdomen is mostly confluent, no longer desquamating.  Rash at bilateral lower extremities continues to be more petechial in nature but now less erythematous. Increased desquamation at  upper extremities and palms. Extremities: 2+ RLE pitting edema and 1+ left lower extremity edema.  Neuro:  alert and oriented.  Normal speech.  No focal deficits Psych: Appears euthymic   Labs:  CBC:  Results from last 7 days  Lab Units 11/12/23 0517 11/11/23 0540 11/10/23 0500 11/09/23 0540  WHITE BLOOD CELL COUNT 10*3/uL 9.30 9.50 10.70 10.40  HEMOGLOBIN g/dL 8.6* 9.1* 9.2* 8.5*  HEMATOCRIT % 25.8* 27.1* 27.1* 24.2*  PLATELET COUNT 10*3/uL 135* 177 195 192  , DIFF:  Results from last 7 days  Lab Units 11/12/23 0517 11/11/23 0540 11/10/23 0500 11/09/23 0540 11/07/23 0603 11/06/23 0612  NEUTROPHILS RELATIVE PERCENT % 70 70 70 61 55 62  LYMPHOCYTES RELATIVE PERCENT % 19 19 18 20 14 16   MONOCYTES RELATIVE PERCENT % 10 10 9 10 11 11   BASOPHILS RELATIVE PERCENT % 0 0 0 0 0 0  EOSINOPHILS RELATIVE PERCENT % 1 1 2 10 19 10   NRBC % % 0 0 0 0 0 0  NEUTROPHILS ABSOLUTE COUNT 10*3/uL 6.50 6.70 7.50 6.30 6.10 6.80  LYMPHOCYTES ABSOLUTE COUNT 10*3/uL 1.80 1.80 2.00 2.00 1.60 1.80  MONOCYTES ABSOLUTE COUNT 10*3/uL 1.00* 0.90* 1.00* 1.10* 1.30* 1.20*  BASOPHILS ABSOLUTE COUNT 10*3/uL 0.00 0.00 0.00 0.00 0.00 0.00  EOSINOPHILS ABSOLUTE COUNT 10*3/uL 0.00 0.10 0.30 1.00* 2.00* 1.10*  , CMP:   Results from last 7 days  Lab  Units 11/12/23 0518 11/11/23 0540 11/10/23 0500 11/09/23 0540  SODIUM mmol/L 137 140 138 139  POTASSIUM mmol/L 3.8 4.0 3.8 3.7  CHLORIDE mmol/L 104 108* 106 107  CO2 mmol/L 28 27 27 27   BUN mg/dL 32* 32* 31* 39*  CREATININE mg/dL 9.13 9.04 8.95 8.78  CALCIUM mg/dL 7.7* 7.8* 7.8* 7.6*  MAGNESIUM mg/dL 1.8* 1.8* 1.5* 1.9  PHOSPHORUS mg/dL 3.4 3.2 2.9 2.8  BILIRUBIN TOTAL mg/dL 0.3 0.4 0.4 0.3  AST U/L 14 15 16 14   ALT U/L 22 23 24 22   TOTAL PROTEIN g/dL 4.4* 4.5* 4.6* 4.6*  ALBUMIN g/dL 2.3* 2.5* 2.6* 2.4*  ANION GAP mmol/L 5* 5* 5* 5*  , Coags:   Results from last 7 days  Lab Units 11/12/23 0517  INR  1.1   , Cardiac:  No results for input(s): CK,  CKMB, BNP in the last 72 hours.  Invalid input(s): TROPONINI, CKMBP, Glycemic control:   Lab Results  Component Value Date/Time   POCGLU 85 11/12/2023 1223   POCGLU 86 11/12/2023 0801   POCGLU 121 (H) 11/11/2023 2016   POCGLU 173 (H) 11/11/2023 1706   POCGLU 131 (H) 11/11/2023 1155   POCGLU 81 11/11/2023 0805   and Blood Gas:     Invalid input(s): BDF, O2S   Radiology:     CT Abdomen Pelvis W Contrast  Final Result by Bonna Stallion, MD (08/01 1511)  CT ABDOMEN PELVIS W CONTRAST, 11/01/2023 2:12 PM    INDICATION: Sepsis   COMPARISON: Outside MRI L-spine 09/16/2023, images not available    TECHNIQUE: CT images of the abdomen and pelvis were obtained after   intravenous administration of iodinated contrast. Conventional axial   reconstructions and multiplanar reformatted images were submitted for   review.     FINDINGS:    . Lower Chest: Moderate right-sided pleural effusion with adjacent partial   collapse of the right lower lobe. Prior CABG and median sternotomy,   partially visualized.    . Liver: No suspicious focal findings.  . Gallbladder/Biliary: Unremarkable.  SABRA Spleen: Unremarkable.  . Pancreas: Unremarkable.  . Adrenals: Unremarkable.  . Kidneys: Unremarkable.    . Peritoneum/Mesenteries/Extraperitoneum: No free air. No free fluid. No   pathologically enlarged lymph nodes.  . Gastrointestinal tract: No evidence of obstruction. Duodenal   diverticulum.    . Ureters: Unremarkable.  . Bladder: Unremarkable.  . Reproductive System: Unremarkable.    . Vascular: Extensive aortobiliac calcifications. No aortic aneurysm.  . Musculoskeletal: Irregular contour of the inferior endplate of L3 and   superior endplate of L4. Anterolateral to the right eccentric portion of   this L3-L4 disc space is a peripherally enhancing centrally   hypoattenuating 1.9 x 3.1 cm fluid collection. Mild stranding adjacent to   the right psoas muscle adjacent at the level  of the fluid collection.   Anasarca. No acute displaced fractures. Polyarticular degenerative   changes. S-shaped scoliosis of the thoracolumbar spine.      IMPRESSION:  1.  Sequela of discitis-osteomyelitis with prevertebral abscess at the   level of the L3-L4 disc space.   2.  Possible right-sided psoas myositis. For more sensitive evaluation   refer to previous outside MRI L-spine dated 09/16/2023.  3.  Moderate right-sided pleural effusion with adjacent partial collapse   of the right lower lobe.    CT Angio Chest Pulmonary Embolism  Final Result by Eulas Carlota Dauer, MD (08/01 1616)  CT ANGIO CHEST PULMONARY EMBOLISM, 11/01/2023 2:11 PM    INDICATION:  Pulmonary embolism (PE) suspected, unknown D-dimer   COMPARISON: CT chest 07/10/2021    TECHNIQUE: Iodinated contrast was administered intravenously by rapid   injection, with further multislice axial sections acquired in the   pulmonary arterial phase from the thoracic inlet to the upper abdomen.   Coronal maximal intensity projection images were created for comprehensive   analysis and diagnosis of the regional circulation.    All CT scans at [facility] and [facility]   Imaging are performed using radiation dose optimization techniques as   appropriate to a performed exam, including but not limited to one or more   of the following: automatic exposure control, adjustment of the mA and/or   kV according to patient size, use of iterative reconstruction technique.   In addition, our institution participates in a radiation dose monitoring   program to optimize patient radiation exposure.    FINDINGS:     Pulmonary arteries: The study is somewhat limited by motion respiratory   artifact and poor contrast timing with multiple flow artifact and   nonopacified pulmonary arteries for example in indexed pulmonary arteries   8:210, 363, 387. No central pulmonary emboli identified.     Aorta/great vessels: No aneurysm. Mild thoracic aorta calcification that   extends into the supra aortic branches. Left arm PICC line with tip   terminates in the superior cavoatrial junction.    Thoracic inlet/central airways: Thyroid normal. Airway patent.    Mediastinum/hila/axilla: Right hilar lymph node measuring 8 mm in short   axis.    Heart: Post op changes of CABG with heavily calcified native coronary   arteries. Normal heart size. No pericardial effusion.    Lungs/pleura: Moderate right pleural effusion with adjacent compressive   atelectasis. Prominent motion respiratory artifact limiting detailed   evaluation of lung parenchyma. Subtle multifocal centrilobular   nodules/tree-in-bud opacity seen in 5 pulmonary lobes predominantly in the   upper lobe. Similar left lower lobe 4 mm nodule (7:230).    Upper abdomen: Visualized upper abdomen is within normal limits.    Chest wall/MSK: Chest wall anasarca. Multilevel degenerative changes of   the spine and bilateral shoulder. Intact and well aligned midline   sternotomy wires. No acute fracture or suspicious osseous lesion.    IMPRESSION:  *  Limited exam. No central pulmonary embolus or right heart strain. If   clinical suspicion remains, Doppler ultrasound of the lower extremities   may be considered for further assessment.  *  Subtle multifocal centrilobular nodules/tree-in-bud opacities   bilaterally and diffuse bronchial wall thickening, may represent   aspiration  *  Small to moderate right pleural effusion with adjacent compressive   atelectasis.    XR Chest 1 View  Final Result by Amada Hildy Carwin, MD (08/01 1356)  XR CHEST 1 VIEW, 11/01/2023 12:26 PM    INDICATION:fever   COMPARISON: Chest x-ray 10/21/2023    FINDINGS:     Supportive devices: Similar positioning of left upper extremity PICC.  Cardiovascular/lungs/pleura: Postsurgical appearance of the   cardiomediastinal silhouette status post CABG.  Wedge-shaped confluent   opacity silhouetting the right diaphragm which appears more conspicuous   compared to prior. Adjacent hazy/patchy opacities in the right lung base.   Mild interstitial prominence in the right greater than left lungs.  Other: Possible fracture of the inferior most median sternotomy wire, not   well evaluated due to technique. Degenerative changes.    IMPRESSION:  Increased patchy opacity in the right lower lung,  likely representing   aspiration/pneumonia with parapneumonic effusion.         Hospital Checklist:  #Prophylaxis:  - DVT Ppx: Heparin SQ every 8 hours - Stress Ulcer:   #FEN:  - Adult Diet- Regular  #Ethics:  - Code Status: Full Code  #Discharge Planning:  - Dispo: - PT: Home PT - OT: - SLP: - Outpatient Followup Items:      Electronically signed by: Otha Lavada Blanch, MD 11/12/2023 4:20 PM

## 2023-11-12 NOTE — Telephone Encounter (Signed)
 Call placed to [facility], they have uploaded it to powershare now, I re faxed the order for them to the number provided. FYI to provider.

## 2023-11-12 NOTE — Care Plan (Signed)
  Problem: Safety: Goal: Will remain free from falls by discharge Description: Will remain free from falls by discharge Outcome: Progressing   Problem: Knowledge Deficit Goal: Patient/family/caregiver demonstrates understanding of disease process, treatment plan, medications, and discharge instructions Description: Complete learning assessment and assess knowledge base. Outcome: Progressing   Problem: Compromised Skin Integrity Goal: Skin integrity is maintained or improved Description: Assess and monitor skin integrity. Identify patients at risk for skin breakdown on admission and per policy. Collaborate with interdisciplinary team and initiate plans and interventions as needed.    Outcome: Progressing Goal: Fluid and electrolyte balance are achieved/maintained Description: Assess and monitor vital signs (orthostatic vitals if applicable), fluid intake and output, urine color, labs, skin turgor, mucous membranes, jugular venous distention, edema, circumference of edematous extremities and abdominal girth, respiratory status, and mental status.  Monitor for signs and symptoms of hypovolemia (tachycardia, rapid breathing, decreased urine output, postural hypotension, confusion, syncope).  Monitor for signs and symptoms of hypervolemia (strong rapid pulse, shortness of breath, difficulty breathing lying down, crackles heard in lung fields, edema). Collaborate with interdisciplinary team and initiate plan and interventions as ordered. Outcome: Progressing

## 2023-11-13 NOTE — Progress Notes (Signed)
 Case Management Update  Date: 11/13/2023   Time: 9:10 AM   Patient Type: Inpatient  0900- MSW spoke with Penne at [facility] and [facility]. SNF authorization is under review.     Post Acute Placement Status: Review currently in progress.  Case Management Coordination Status: Coordination In-Progress    Anticipated Discharge Location: Skilled Nursing Facility - short-term rehab  If Plan A discharging location is not feasible: Potential Plan B: Home with Home Health         Tristan Smith, MSW

## 2023-11-13 NOTE — Progress Notes (Signed)
 ------------------------------------------------------------------------------- Attestation signed by Silvano Cy Leaven, MD at 11/13/2023  9:14 PM I performed the service or was physically present during the critical or key portions of the service furnished by me; and he/she managed the patient. Mr. Tristan Smith's facial rash is improved but his bilateral palms and legs continue to be flaky.  He is very deconditioned and tires easily so certainly could benefit from further rehab at [place].  Total time in patient care 50 min.   Silvano Cy Leaven, MD 11/13/2023  -------------------------------------------------------------------------------  Gerontology (ACE Unit) Progress Note   Assessment/Plan: Tristan Smith is a 78 y.o. male with PMH of COPD, HFpEF, CAD s/p CABG (~2021), HTN, GERD, spinal degenerative disc disease, and former smoker presenting from a SNF with a rash and episode of fever (101.3).   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  Rash seems to have started on a day when vancomycin  levels were collected and found to be elevated (>26).  He was then switched to daptomycin however the rash progressively worsened.  Also associated with fevers, nausea, diarrhea and found to have an AKI.  Patient's presentation is concerning for drug reaction, dress syndrome. -Haptoglobin slightly elevated at 263.  LDH and bilirubin are wnl making hemolytic anemia unlikely. PLAN: - Dermatology consulted in ED - Triamcinolone  0.1% cream to rash  - Topical hydrocortisone 1% (2.5% not on formulary) twice daily to affected areas on face  - Transition to prednisone 60 mg on 8/9 and taper down by 5mg  every 3 days  - Dermatology plans to follow up 1-2 weeks after discharge to reassess -  Infectious disease consulted in ED - Cefepime discontinued 8/4. Linezolid discontinued 8/12.  - Continue levofloxacin 750 mig daily and rifampin 350 mg BID - PPI BID    # Lumbar discitis and paraspinal abscesses - Patient with recent hospitalization from 6/11 through 6/29 during which he presented with severe back pain and was found to have lumbar discitis and paraspinal abscess with cultures positive for MRSA.  Potential etiologies for abscesses recent dental procedure, recent epidural injection.  He was discharged with a plan for 6 to 8 weeks of vancomycin .  Vancomycin  was discontinued due to progressively worsening rash and was transitioned to daptomycin.  Tentative EOT was 11/13/2023 pending repeat imaging. PLAN: - Infectious disease consulted in ED as discussed above - Cefepime 1 g every 8 hours, discontinued 8/4 - Linezolid discontinued 8/12  - Continue levofloxacin 750 mig daily and rifampin 350 mg BID (anticipate for another 6-8 weeks, tentatively through 01/02/2024) - Plan for repeat MRI L-spine wwo week of 12/23/2023 - Tylenol for fevers as needed    #Acute Kidney Injury, resolved -Cr elevated at 1.47 no presentation. Baseline around 0.9. - Potential etiologies include prerenal (decreased p.o. intake), DRESS syndrome, hypotension with subsequent possible ATN 2/2 infection and DRESS (with lower MAPs in the morning into the afternoon of 8/2 as well), contrast-induced, AIN/drug-induced PLAN: - Nephrology consulted   - No M spike on SPEP  - Started lasix 40 mg po 8/9 - S/p Foley catheter removal and successful voiding trial    # R olecranon bursitis - Patient with right olecranon bursitis on recent hospitalization in June.  On exam, right elbow is warm to touch and somewhat tender. PLAN: - Ortho consult on 8/2 felt that no fluid aspiration or other interventions were necessary    #  Chronic anemia #Eosinophilia - Hgb 8.0 today but not inconsistent with values of 8.0-8.4 at SNF in  late July. Reviewing hemoglobin over the past, he has not been greater than 9.0 x 3 years and is usually in the 8's. ID feels that Linezeolid associated anemia would not occur so quickly after starting it so we will not adjust antibiotics at this time.  - Iron profile suggestive of anemia of chronic disease - Eosinophilia has normalized today.  PLAN: - Daily CBC with diff - Hematology consulted  - No plans for bone marrow biopsy  - Re-engage if cytopenias do not improve with supportive measures and potential acute offenders such as infection/DRESS syndrome - Folic acid 1 mg po daily for marrow support     #HFpEF #HTN -Given that renal function is at baseline, home antihypertensives have been resumed.  Home Coreg was increased from 3.125 mg to 6.25 mg twice daily on 8/6. PLAN: - Increased coreg 12.5 mg twice daily (increased from home 3.125 mg twice daily) - HCTZ 25 mg daily (home dose) - Losartan 50 mg twice daily (home dose) - Lasix 40 mg daily (increased from home dose of Lasix 20 mg daily) - Hydralazine 25 mg q8h daily (not a home med)  Chronic Medical Problems: #CAD s/p CABG: Continue home aspirin #HLD: Continue home statin #COPD: LABA/LAMA/ICS nebs twice daily #Spinal DDD: Continue home duloxetine 20 mg daily #GERD: Continue home PPI - especially in the setting of his high-dose steroid use at present.   Daily Summary 11/13/2023: Hospital Day 12  No acute events overnight. Patient seen this morning. He is resting in bed. He states he had a breathing treatment this morning. He says the rash is improved at his lips. He does intermittently have small bleeding episodes at his face/scalp. States diarrhea is improving.   OBJECTIVE: Vital Signs:  Temp:  [97.6 F (36.4 C)-98.6 F (37 C)] 97.6 F (36.4 C) Heart Rate:  [70-88] 70 Resp:  [17-18] 18 BP: (143-179)/(60-90) 143/60   General:  NAD.   HEENT:  Bandaged but no active bleeding at scalp Cardiovascular: Regular rate and  rhythm Respiratory: Normal work of breathing Abdomen:  Soft, non-distended, non-tender, normoactive bowel sounds. Skin/MSK: Confluent erythema at back and abdomen.  Rash at bilateral lower extremities continues to be more petechial in nature but now less erythematous and somewhat desquamating. Increased desquamation at upper extremities and palms. Facial desquamation improved. Extremities: 1+ bilateral pitting edema  Neuro:  alert and oriented.  Normal speech.  No focal deficits Psych: Appears euthymic   Labs:  CBC:  Results from last 7 days  Lab Units 11/13/23 0509 11/12/23 0517 11/11/23 0540 11/10/23 0500  WHITE BLOOD CELL COUNT 10*3/uL 9.10 9.30 9.50 10.70  HEMOGLOBIN g/dL 8.3* 8.6* 9.1* 9.2*  HEMATOCRIT % 25.0* 25.8* 27.1* 27.1*  PLATELET COUNT 10*3/uL 141* 135* 177 195  , DIFF:  Results from last 7 days  Lab Units 11/13/23 0509 11/12/23 0517 11/11/23 0540 11/10/23 0500 11/09/23 0540 11/07/23 0603  NEUTROPHILS RELATIVE PERCENT % 70 70 70 70 61 55  LYMPHOCYTES RELATIVE PERCENT % 20 19 19 18 20 14   MONOCYTES RELATIVE PERCENT % 11 10 10 9 10 11   BASOPHILS RELATIVE PERCENT % 0 0 0 0 0 0  EOSINOPHILS RELATIVE PERCENT % 0 1 1 2 10 19   NRBC % % 0 0 0 0 0 0  NEUTROPHILS ABSOLUTE COUNT 10*3/uL 6.30 6.50 6.70 7.50 6.30 6.10  LYMPHOCYTES ABSOLUTE COUNT 10*3/uL 1.80 1.80 1.80 2.00 2.00 1.60  MONOCYTES ABSOLUTE COUNT 10*3/uL 1.00* 1.00* 0.90* 1.00* 1.10* 1.30*  BASOPHILS ABSOLUTE COUNT 10*3/uL 0.00 0.00 0.00 0.00 0.00 0.00  EOSINOPHILS ABSOLUTE COUNT 10*3/uL 0.00 0.00 0.10 0.30 1.00* 2.00*  , CMP:   Results from last 7 days  Lab Units 11/13/23 0509 11/12/23 0518 11/11/23 0540 11/10/23 0500  SODIUM mmol/L 139 137 140 138  POTASSIUM mmol/L 3.7 3.8 4.0 3.8  CHLORIDE mmol/L 104 104 108* 106  CO2 mmol/L 29 28 27 27   BUN mg/dL 33* 32* 32* 31*  CREATININE mg/dL 9.24 9.13 9.04 8.95  CALCIUM mg/dL 7.6* 7.7* 7.8* 7.8*  MAGNESIUM mg/dL 1.6* 1.8* 1.8* 1.5*  PHOSPHORUS mg/dL 3.6 3.4  3.2 2.9  BILIRUBIN TOTAL mg/dL 0.6 0.3 0.4 0.4  AST U/L 15 14 15 16   ALT U/L 25 22 23 24   TOTAL PROTEIN g/dL 4.3* 4.4* 4.5* 4.6*  ALBUMIN g/dL 2.4* 2.3* 2.5* 2.6*  ANION GAP mmol/L 6 5* 5* 5*  , Coags:   Results from last 7 days  Lab Units 11/12/23 0517  INR  1.1   , Cardiac:  No results for input(s): CK, CKMB, BNP in the last 72 hours.  Invalid input(s): TROPONINI, CKMBP, Glycemic control:   Lab Results  Component Value Date/Time   POCGLU 138 (H) 11/13/2023 1143   POCGLU 105 (H) 11/13/2023 0804   POCGLU 152 (H) 11/12/2023 2011   POCGLU 162 (H) 11/12/2023 1752   POCGLU 85 11/12/2023 1223   POCGLU 86 11/12/2023 0801   and Blood Gas:     Invalid input(s): BDF, O2S   Radiology:     CT Abdomen Pelvis W Contrast  Final Result by Bonna Stallion, MD (08/01 1511)  CT ABDOMEN PELVIS W CONTRAST, 11/01/2023 2:12 PM    INDICATION: Sepsis   COMPARISON: Outside MRI L-spine 09/16/2023, images not available    TECHNIQUE: CT images of the abdomen and pelvis were obtained after   intravenous administration of iodinated contrast. Conventional axial   reconstructions and multiplanar reformatted images were submitted for   review.     FINDINGS:    . Lower Chest: Moderate right-sided pleural effusion with adjacent partial   collapse of the right lower lobe. Prior CABG and median sternotomy,   partially visualized.    . Liver: No suspicious focal findings.  . Gallbladder/Biliary: Unremarkable.  SABRA Spleen: Unremarkable.  . Pancreas: Unremarkable.  . Adrenals: Unremarkable.  . Kidneys: Unremarkable.    . Peritoneum/Mesenteries/Extraperitoneum: No free air. No free fluid. No   pathologically enlarged lymph nodes.  . Gastrointestinal tract: No evidence of obstruction. Duodenal   diverticulum.    . Ureters: Unremarkable.  . Bladder: Unremarkable.  . Reproductive System: Unremarkable.    . Vascular: Extensive aortobiliac calcifications. No aortic aneurysm.  .  Musculoskeletal: Irregular contour of the inferior endplate of L3 and   superior endplate of L4. Anterolateral to the right eccentric portion of   this L3-L4 disc space is a peripherally enhancing centrally   hypoattenuating 1.9 x 3.1 cm fluid collection. Mild stranding adjacent to   the right psoas muscle adjacent at the level of the fluid collection.   Anasarca. No acute displaced fractures. Polyarticular degenerative   changes. S-shaped scoliosis of the thoracolumbar spine.      IMPRESSION:  1.  Sequela of discitis-osteomyelitis with prevertebral abscess at the   level of the L3-L4 disc space.   2.  Possible right-sided psoas myositis. For more sensitive evaluation   refer to previous outside MRI L-spine dated 09/16/2023.  3.  Moderate  right-sided pleural effusion with adjacent partial collapse   of the right lower lobe.    CT Angio Chest Pulmonary Embolism  Final Result by Eulas Carlota Dauer, MD (08/01 1616)  CT ANGIO CHEST PULMONARY EMBOLISM, 11/01/2023 2:11 PM    INDICATION: Pulmonary embolism (PE) suspected, unknown D-dimer   COMPARISON: CT chest 07/10/2021    TECHNIQUE: Iodinated contrast was administered intravenously by rapid   injection, with further multislice axial sections acquired in the   pulmonary arterial phase from the thoracic inlet to the upper abdomen.   Coronal maximal intensity projection images were created for comprehensive   analysis and diagnosis of the regional circulation.    All CT scans at [facility] and [facility]   Imaging are performed using radiation dose optimization techniques as   appropriate to a performed exam, including but not limited to one or more   of the following: automatic exposure control, adjustment of the mA and/or   kV according to patient size, use of iterative reconstruction technique.   In addition, our institution participates in a radiation dose monitoring   program to optimize  patient radiation exposure.    FINDINGS:     Pulmonary arteries: The study is somewhat limited by motion respiratory   artifact and poor contrast timing with multiple flow artifact and   nonopacified pulmonary arteries for example in indexed pulmonary arteries   8:210, 363, 387. No central pulmonary emboli identified.    Aorta/great vessels: No aneurysm. Mild thoracic aorta calcification that   extends into the supra aortic branches. Left arm PICC line with tip   terminates in the superior cavoatrial junction.    Thoracic inlet/central airways: Thyroid normal. Airway patent.    Mediastinum/hila/axilla: Right hilar lymph node measuring 8 mm in short   axis.    Heart: Post op changes of CABG with heavily calcified native coronary   arteries. Normal heart size. No pericardial effusion.    Lungs/pleura: Moderate right pleural effusion with adjacent compressive   atelectasis. Prominent motion respiratory artifact limiting detailed   evaluation of lung parenchyma. Subtle multifocal centrilobular   nodules/tree-in-bud opacity seen in 5 pulmonary lobes predominantly in the   upper lobe. Similar left lower lobe 4 mm nodule (7:230).    Upper abdomen: Visualized upper abdomen is within normal limits.    Chest wall/MSK: Chest wall anasarca. Multilevel degenerative changes of   the spine and bilateral shoulder. Intact and well aligned midline   sternotomy wires. No acute fracture or suspicious osseous lesion.    IMPRESSION:  *  Limited exam. No central pulmonary embolus or right heart strain. If   clinical suspicion remains, Doppler ultrasound of the lower extremities   may be considered for further assessment.  *  Subtle multifocal centrilobular nodules/tree-in-bud opacities   bilaterally and diffuse bronchial wall thickening, may represent   aspiration  *  Small to moderate right pleural effusion with adjacent compressive   atelectasis.    XR Chest 1 View  Final Result by Amada Hildy Carwin, MD (08/01 1356)  XR CHEST 1 VIEW, 11/01/2023 12:26 PM    INDICATION:fever   COMPARISON: Chest x-ray 10/21/2023    FINDINGS:     Supportive devices: Similar positioning of left upper extremity PICC.  Cardiovascular/lungs/pleura: Postsurgical appearance of the   cardiomediastinal silhouette status post CABG. Wedge-shaped confluent   opacity silhouetting the right diaphragm which appears more conspicuous   compared to prior. Adjacent hazy/patchy opacities in the right lung base.  Mild interstitial prominence in the right greater than left lungs.  Other: Possible fracture of the inferior most median sternotomy wire, not   well evaluated due to technique. Degenerative changes.    IMPRESSION:  Increased patchy opacity in the right lower lung, likely representing   aspiration/pneumonia with parapneumonic effusion.         Hospital Checklist:  #Prophylaxis:  - DVT Ppx: Heparin SQ every 8 hours - Stress Ulcer:   #FEN:  - Adult Diet- Regular  #Ethics:  - Code Status: Full Code  #Discharge Planning:  - Dispo: - PT: Home PT - OT: - SLP: - Outpatient Followup Items:      Electronically signed by: Otha Lavada Blanch, MD 11/13/2023 3:49 PM

## 2023-11-13 NOTE — Telephone Encounter (Signed)
 LVM for the patient to call back. Hosp f/u est DRESS/DIHS (2 week-per Dr. Tobey)

## 2023-11-13 NOTE — Group Note (Signed)
 Inpatient Care Coordination Team Conference Note  11/13/2023   Time:2:33 PM   CSN: [id number]  DOB: 1945-11-15   Room/Bed: S259/A LOS: 12 Payor Info: Payor: BLUEMEDICARE / Plan: BLUEMEDICARE / Product Type: Medicare Advantage /    Admitting Diagnosis: Rash [R21]  Admit Date/Time: 11/01/2023 11:43 AM Admission Comments: No comment available   Primary Diagnosis: Rash Principal Problem: Rash  Predictive Model Details        30.6% (High)  Factor Value   Calculated 11/13/2023 12:06 16% Number of ED visits in last 90 days 3   Readmission Risk Score v2 Model 10% Latest hemoglobin in last 72 hrs 8.3 g/dL    8% Number of active outpatient medication orders 26    8% Number of hospitalizations in last year 0    7% Braden score 18     Team Members Present: Case Manager, Advanced Practice Provider, Nurse, Physical Therapist, Provider, Social Worker  Expected Discharge Date: Nov 12, 2023  Odella CHRISTELLA Gosling, MSW

## 2023-11-14 NOTE — Progress Notes (Signed)
 Physician's Medical Necessity Certification Form (Required for Medicare / Medicaid Beneficiaries for non-emergency, scheduled or non-scheduled, ambulance transportation)  Ambulance Company and Contact #: [facility]  [phone])  Patient's Name: Tristan Smith Social Security Number: [id number] Address: [address] [place] [address] Phone Number: [phone] (home)  Transfer Information  Date of Service: 11/14/23 Origin of Transport:  [facility] Transport Destination:  Pt's home: 4 steps to enter the home. Drop Off Address: [address] [place] [address] Phone: Baisch,libby (Spouse)[phone] City: [place] State: [place] Please Check: Residence  Medical Guidelines  Medicare Regulations, Part 410.40 (d)(1) defines Medical Necessity and Bed Confined In order for ambulance service to be covered, they must be medically necessary and reasonable.  Medical necessity is established when the patients' condition is such that transportation by other means is contraindicated.  The Health Care Financing Administration has defined Bed Confinement as: Patient is unable to get up from bed without assistance Patient is unable to ambulate Patient is unable to sit in a chair or wheelchair  If the said patient does not meet Bed Confined criteria as defined in the Medicare regulation, can this patient be transported safely unmonitored in a wheelchair van?  No  If No, please indicate the appropriate medical condition(s):  Immobilization, Severe Weakness (lumbar discitis and paraspinal abscess with cultures positive for MRSA)  ---------------------------------------------------------------------------------------------------------------------- I certify that the above information is true and correct based on my evaluation of this patient to the best of my knowledge and professional training, as supported  in the medical record of this patient.   I understand that this information will be used by the Department of Health and Health and safety inspector, Healthcare Financing Administration to support the determination of medical necessity for ambulance services.  I certify that the above information is true and correct based on my evaluation of this patient to the best of my knowledge and professional training, as supported in the medical record of this patient.  It is understood that I, the Medical Professional; being a Designer, jewellery, Case Designer, television/film set, has received a verbal order from the assigned physician, being Dr. Dr. Jori Blake on the Date of 11/14/2023, to authorize the medical necessity for the transport of the said patient above.  I understand that this information will be used by the Department of Health and Health and safety inspector, Healthcare Financing Administration to support the determination of medical necessity for ambulance services.    Signature of Medical Professional:  Tristan Smith, MSW Date:  11/14/2023

## 2023-11-14 NOTE — Progress Notes (Addendum)
 Case Management Update  Date: 11/14/2023   Time: 11:26 AM   Patient Type: Inpatient  CM spoke with [facility] about update on patients authorization. Peer to Peer has been requested for this patient to be completed by provider by 3 PM 11/14/2023 please call [phone] [facility] Medicare). Option 5. Patients Member ID [id number]. CM updated provider via secure chat. Care Coordination will continue to follow.    1425. CM was notified by [facility] that the patient was denied for STR. CM updated patient.   Post Acute Placement Status: Review currently in progress.  Case Management Coordination Status: Coordination In-Progress    Anticipated Discharge Location: Skilled Nursing Facility - short-term rehab  If Plan A discharging location is not feasible: Potential Plan B: Home         [employer], MSW

## 2023-11-14 NOTE — Progress Notes (Signed)
 Case Management Discharge Note        CSN: [id number] DOB: 21-Apr-1945 Service: Gerontology Location: S259/A  Patient Class: Inpatient  DC Disposition: : Home Health Care  Discharge DC Disposition: : Home Health Care Homecare Referral: (PT) Physical Therapy, (OT) Occupational Therapy, (SN) Skilled Nursing Homecare/Hospice Agency(s) chosen: [facility] DME: Bedside commode, Rolling Walker Patient DME Agency: Therapist, music Discharge Transport: Ambulance Discharge Transport Agency Chosen: Civil engineer, contracting [phone])  Discharge Referrals Services Provided: Transportation, Skilled Nursing, Physical Therapy Patient Preference: Chosen geographical local area/county shared with patient/family: Yes Date chosen geographical local area/county list shared with patient/family: 11/05/23 Patient Preference for Post-Acute Provider Form completed: Yes Case closed, patient/family agree with disposition plan: Yes      Post Acute Placement Status: Coordination complete.   Case Management Coordination Status: Coordination Complete     Tristan Smith, MSW

## 2023-11-15 ENCOUNTER — Telehealth: Payer: Self-pay

## 2023-11-15 DIAGNOSIS — H02833 Dermatochalasis of right eye, unspecified eyelid: Secondary | ICD-10-CM | POA: Diagnosis not present

## 2023-11-15 DIAGNOSIS — T368X5D Adverse effect of other systemic antibiotics, subsequent encounter: Secondary | ICD-10-CM | POA: Diagnosis not present

## 2023-11-15 DIAGNOSIS — E43 Unspecified severe protein-calorie malnutrition: Secondary | ICD-10-CM | POA: Diagnosis not present

## 2023-11-15 DIAGNOSIS — D7212 Drug rash with eosinophilia and systemic symptoms syndrome: Secondary | ICD-10-CM | POA: Diagnosis not present

## 2023-11-15 DIAGNOSIS — K6812 Psoas muscle abscess: Secondary | ICD-10-CM | POA: Diagnosis not present

## 2023-11-15 DIAGNOSIS — M4646 Discitis, unspecified, lumbar region: Secondary | ICD-10-CM | POA: Diagnosis not present

## 2023-11-15 DIAGNOSIS — R197 Diarrhea, unspecified: Secondary | ICD-10-CM | POA: Diagnosis not present

## 2023-11-15 DIAGNOSIS — I5030 Unspecified diastolic (congestive) heart failure: Secondary | ICD-10-CM | POA: Diagnosis not present

## 2023-11-15 DIAGNOSIS — M4150 Other secondary scoliosis, site unspecified: Secondary | ICD-10-CM | POA: Diagnosis not present

## 2023-11-15 DIAGNOSIS — M4712 Other spondylosis with myelopathy, cervical region: Secondary | ICD-10-CM | POA: Diagnosis not present

## 2023-11-15 DIAGNOSIS — M4626 Osteomyelitis of vertebra, lumbar region: Secondary | ICD-10-CM | POA: Diagnosis not present

## 2023-11-15 DIAGNOSIS — D649 Anemia, unspecified: Secondary | ICD-10-CM | POA: Diagnosis not present

## 2023-11-15 DIAGNOSIS — M4802 Spinal stenosis, cervical region: Secondary | ICD-10-CM | POA: Diagnosis not present

## 2023-11-15 DIAGNOSIS — J449 Chronic obstructive pulmonary disease, unspecified: Secondary | ICD-10-CM | POA: Diagnosis not present

## 2023-11-15 DIAGNOSIS — M7021 Olecranon bursitis, right elbow: Secondary | ICD-10-CM | POA: Diagnosis not present

## 2023-11-15 DIAGNOSIS — N179 Acute kidney failure, unspecified: Secondary | ICD-10-CM | POA: Diagnosis not present

## 2023-11-15 DIAGNOSIS — I110 Hypertensive heart disease with heart failure: Secondary | ICD-10-CM | POA: Diagnosis not present

## 2023-11-15 DIAGNOSIS — Z87891 Personal history of nicotine dependence: Secondary | ICD-10-CM | POA: Diagnosis not present

## 2023-11-15 DIAGNOSIS — Z951 Presence of aortocoronary bypass graft: Secondary | ICD-10-CM | POA: Diagnosis not present

## 2023-11-15 DIAGNOSIS — I25810 Atherosclerosis of coronary artery bypass graft(s) without angina pectoris: Secondary | ICD-10-CM | POA: Diagnosis not present

## 2023-11-15 DIAGNOSIS — M19011 Primary osteoarthritis, right shoulder: Secondary | ICD-10-CM | POA: Diagnosis not present

## 2023-11-15 DIAGNOSIS — B9561 Methicillin susceptible Staphylococcus aureus infection as the cause of diseases classified elsewhere: Secondary | ICD-10-CM | POA: Diagnosis not present

## 2023-11-15 DIAGNOSIS — G8929 Other chronic pain: Secondary | ICD-10-CM | POA: Diagnosis not present

## 2023-11-15 DIAGNOSIS — G061 Intraspinal abscess and granuloma: Secondary | ICD-10-CM | POA: Diagnosis not present

## 2023-11-15 DIAGNOSIS — I4819 Other persistent atrial fibrillation: Secondary | ICD-10-CM | POA: Diagnosis not present

## 2023-11-15 NOTE — Transitions of Care (Post Inpatient/ED Visit) (Signed)
   11/15/2023  Name: KEOLA HENINGER MRN: [id number] DOB: [birth year]  Today's TOC FU Call Status: Today's TOC FU Call Status:: Unsuccessful Call (1st Attempt) Unsuccessful Call (1st Attempt) Date: 11/15/23  Attempted to reach the patient regarding the most recent Inpatient/ED visit.  Follow Up Plan: Additional outreach attempts will be made to reach the patient to complete the Transitions of Care (Post Inpatient/ED visit) call.   Medford Balboa, BSN, RN [place]  VBCI - [employer] [facility] RN Care Manager [phone]

## 2023-11-17 DIAGNOSIS — J449 Chronic obstructive pulmonary disease, unspecified: Secondary | ICD-10-CM | POA: Diagnosis not present

## 2023-11-17 DIAGNOSIS — I110 Hypertensive heart disease with heart failure: Secondary | ICD-10-CM | POA: Diagnosis not present

## 2023-11-17 DIAGNOSIS — G061 Intraspinal abscess and granuloma: Secondary | ICD-10-CM | POA: Diagnosis not present

## 2023-11-17 DIAGNOSIS — D7212 Drug rash with eosinophilia and systemic symptoms syndrome: Secondary | ICD-10-CM | POA: Diagnosis not present

## 2023-11-17 DIAGNOSIS — T368X5D Adverse effect of other systemic antibiotics, subsequent encounter: Secondary | ICD-10-CM | POA: Diagnosis not present

## 2023-11-17 DIAGNOSIS — R197 Diarrhea, unspecified: Secondary | ICD-10-CM | POA: Diagnosis not present

## 2023-11-17 DIAGNOSIS — M4150 Other secondary scoliosis, site unspecified: Secondary | ICD-10-CM | POA: Diagnosis not present

## 2023-11-17 DIAGNOSIS — M7021 Olecranon bursitis, right elbow: Secondary | ICD-10-CM | POA: Diagnosis not present

## 2023-11-17 DIAGNOSIS — M4712 Other spondylosis with myelopathy, cervical region: Secondary | ICD-10-CM | POA: Diagnosis not present

## 2023-11-17 DIAGNOSIS — B9561 Methicillin susceptible Staphylococcus aureus infection as the cause of diseases classified elsewhere: Secondary | ICD-10-CM | POA: Diagnosis not present

## 2023-11-17 DIAGNOSIS — K6812 Psoas muscle abscess: Secondary | ICD-10-CM | POA: Diagnosis not present

## 2023-11-17 DIAGNOSIS — D649 Anemia, unspecified: Secondary | ICD-10-CM | POA: Diagnosis not present

## 2023-11-17 DIAGNOSIS — I4819 Other persistent atrial fibrillation: Secondary | ICD-10-CM | POA: Diagnosis not present

## 2023-11-17 DIAGNOSIS — E43 Unspecified severe protein-calorie malnutrition: Secondary | ICD-10-CM | POA: Diagnosis not present

## 2023-11-17 DIAGNOSIS — M4802 Spinal stenosis, cervical region: Secondary | ICD-10-CM | POA: Diagnosis not present

## 2023-11-17 DIAGNOSIS — M4626 Osteomyelitis of vertebra, lumbar region: Secondary | ICD-10-CM | POA: Diagnosis not present

## 2023-11-17 DIAGNOSIS — I5030 Unspecified diastolic (congestive) heart failure: Secondary | ICD-10-CM | POA: Diagnosis not present

## 2023-11-17 DIAGNOSIS — M4646 Discitis, unspecified, lumbar region: Secondary | ICD-10-CM | POA: Diagnosis not present

## 2023-11-17 DIAGNOSIS — Z87891 Personal history of nicotine dependence: Secondary | ICD-10-CM | POA: Diagnosis not present

## 2023-11-17 DIAGNOSIS — G8929 Other chronic pain: Secondary | ICD-10-CM | POA: Diagnosis not present

## 2023-11-17 DIAGNOSIS — H02833 Dermatochalasis of right eye, unspecified eyelid: Secondary | ICD-10-CM | POA: Diagnosis not present

## 2023-11-17 DIAGNOSIS — M19011 Primary osteoarthritis, right shoulder: Secondary | ICD-10-CM | POA: Diagnosis not present

## 2023-11-17 DIAGNOSIS — N179 Acute kidney failure, unspecified: Secondary | ICD-10-CM | POA: Diagnosis not present

## 2023-11-17 DIAGNOSIS — Z951 Presence of aortocoronary bypass graft: Secondary | ICD-10-CM | POA: Diagnosis not present

## 2023-11-17 DIAGNOSIS — I25810 Atherosclerosis of coronary artery bypass graft(s) without angina pectoris: Secondary | ICD-10-CM | POA: Diagnosis not present

## 2023-11-18 ENCOUNTER — Telehealth: Payer: Self-pay

## 2023-11-18 NOTE — Transitions of Care (Post Inpatient/ED Visit) (Signed)
   11/18/2023  Name: Tristan Smith MRN: [id number] DOB: 19-Feb-1946  Today's TOC FU Call Status: Today's TOC FU Call Status:: Unsuccessful Call (2nd Attempt) Unsuccessful Call (1st Attempt) Date: 11/15/23 Unsuccessful Call (2nd Attempt) Date: 11/18/23  Attempted to reach the patient regarding the most recent Inpatient/ED visit.  Follow Up Plan: Additional outreach attempts will be made to reach the patient to complete the Transitions of Care (Post Inpatient/ED visit) call.   Medford Balboa, BSN, RN [place]  VBCI - [employer] [facility] RN Care Manager [phone]

## 2023-11-19 ENCOUNTER — Telehealth: Payer: Self-pay

## 2023-11-19 NOTE — Transitions of Care (Post Inpatient/ED Visit) (Signed)
   11/19/2023  Name: Tristan Smith MRN: [id number] DOB: 1945/07/01  Today's TOC FU Call Status: Today's TOC FU Call Status:: Unsuccessful Call (3rd Attempt) Unsuccessful Call (1st Attempt) Date: 11/15/23 Unsuccessful Call (2nd Attempt) Date: 11/18/23 Unsuccessful Call (3rd Attempt) Date: 11/19/23  Attempted to reach the patient regarding the most recent Inpatient/ED visit.  Follow Up Plan: No further outreach attempts will be made at this time. We have been unable to contact the patient.  Medford Balboa, BSN, RN [place]  VBCI - [employer] [facility] RN Care Manager [phone]

## 2023-11-20 DIAGNOSIS — N179 Acute kidney failure, unspecified: Secondary | ICD-10-CM | POA: Diagnosis not present

## 2023-11-20 DIAGNOSIS — R197 Diarrhea, unspecified: Secondary | ICD-10-CM | POA: Diagnosis not present

## 2023-11-20 DIAGNOSIS — G8929 Other chronic pain: Secondary | ICD-10-CM | POA: Diagnosis not present

## 2023-11-20 DIAGNOSIS — H02833 Dermatochalasis of right eye, unspecified eyelid: Secondary | ICD-10-CM | POA: Diagnosis not present

## 2023-11-20 DIAGNOSIS — T368X5D Adverse effect of other systemic antibiotics, subsequent encounter: Secondary | ICD-10-CM | POA: Diagnosis not present

## 2023-11-20 DIAGNOSIS — M19011 Primary osteoarthritis, right shoulder: Secondary | ICD-10-CM | POA: Diagnosis not present

## 2023-11-20 DIAGNOSIS — I110 Hypertensive heart disease with heart failure: Secondary | ICD-10-CM | POA: Diagnosis not present

## 2023-11-20 DIAGNOSIS — I5030 Unspecified diastolic (congestive) heart failure: Secondary | ICD-10-CM | POA: Diagnosis not present

## 2023-11-20 DIAGNOSIS — M4150 Other secondary scoliosis, site unspecified: Secondary | ICD-10-CM | POA: Diagnosis not present

## 2023-11-20 DIAGNOSIS — E43 Unspecified severe protein-calorie malnutrition: Secondary | ICD-10-CM | POA: Diagnosis not present

## 2023-11-20 DIAGNOSIS — M4626 Osteomyelitis of vertebra, lumbar region: Secondary | ICD-10-CM | POA: Diagnosis not present

## 2023-11-20 DIAGNOSIS — M4802 Spinal stenosis, cervical region: Secondary | ICD-10-CM | POA: Diagnosis not present

## 2023-11-20 DIAGNOSIS — M4608 Spinal enthesopathy, sacral and sacrococcygeal region: Secondary | ICD-10-CM | POA: Diagnosis not present

## 2023-11-20 DIAGNOSIS — I25810 Atherosclerosis of coronary artery bypass graft(s) without angina pectoris: Secondary | ICD-10-CM | POA: Diagnosis not present

## 2023-11-20 DIAGNOSIS — M4712 Other spondylosis with myelopathy, cervical region: Secondary | ICD-10-CM | POA: Diagnosis not present

## 2023-11-20 DIAGNOSIS — J449 Chronic obstructive pulmonary disease, unspecified: Secondary | ICD-10-CM | POA: Diagnosis not present

## 2023-11-20 DIAGNOSIS — G061 Intraspinal abscess and granuloma: Secondary | ICD-10-CM | POA: Diagnosis not present

## 2023-11-20 DIAGNOSIS — Z87891 Personal history of nicotine dependence: Secondary | ICD-10-CM | POA: Diagnosis not present

## 2023-11-20 DIAGNOSIS — K6812 Psoas muscle abscess: Secondary | ICD-10-CM | POA: Diagnosis not present

## 2023-11-20 DIAGNOSIS — M7021 Olecranon bursitis, right elbow: Secondary | ICD-10-CM | POA: Diagnosis not present

## 2023-11-20 DIAGNOSIS — D649 Anemia, unspecified: Secondary | ICD-10-CM | POA: Diagnosis not present

## 2023-11-20 DIAGNOSIS — D7212 Drug rash with eosinophilia and systemic symptoms syndrome: Secondary | ICD-10-CM | POA: Diagnosis not present

## 2023-11-20 DIAGNOSIS — M4646 Discitis, unspecified, lumbar region: Secondary | ICD-10-CM | POA: Diagnosis not present

## 2023-11-20 DIAGNOSIS — Z951 Presence of aortocoronary bypass graft: Secondary | ICD-10-CM | POA: Diagnosis not present

## 2023-11-20 DIAGNOSIS — B9561 Methicillin susceptible Staphylococcus aureus infection as the cause of diseases classified elsewhere: Secondary | ICD-10-CM | POA: Diagnosis not present

## 2023-11-20 DIAGNOSIS — I4819 Other persistent atrial fibrillation: Secondary | ICD-10-CM | POA: Diagnosis not present

## 2023-11-22 DIAGNOSIS — I4819 Other persistent atrial fibrillation: Secondary | ICD-10-CM | POA: Diagnosis not present

## 2023-11-22 DIAGNOSIS — M4802 Spinal stenosis, cervical region: Secondary | ICD-10-CM | POA: Diagnosis not present

## 2023-11-22 DIAGNOSIS — D7212 Drug rash with eosinophilia and systemic symptoms syndrome: Secondary | ICD-10-CM | POA: Diagnosis not present

## 2023-11-22 DIAGNOSIS — M19011 Primary osteoarthritis, right shoulder: Secondary | ICD-10-CM | POA: Diagnosis not present

## 2023-11-22 DIAGNOSIS — D649 Anemia, unspecified: Secondary | ICD-10-CM | POA: Diagnosis not present

## 2023-11-22 DIAGNOSIS — Z951 Presence of aortocoronary bypass graft: Secondary | ICD-10-CM | POA: Diagnosis not present

## 2023-11-22 DIAGNOSIS — I5030 Unspecified diastolic (congestive) heart failure: Secondary | ICD-10-CM | POA: Diagnosis not present

## 2023-11-22 DIAGNOSIS — G061 Intraspinal abscess and granuloma: Secondary | ICD-10-CM | POA: Diagnosis not present

## 2023-11-22 DIAGNOSIS — M7021 Olecranon bursitis, right elbow: Secondary | ICD-10-CM | POA: Diagnosis not present

## 2023-11-22 DIAGNOSIS — E43 Unspecified severe protein-calorie malnutrition: Secondary | ICD-10-CM | POA: Diagnosis not present

## 2023-11-22 DIAGNOSIS — G8929 Other chronic pain: Secondary | ICD-10-CM | POA: Diagnosis not present

## 2023-11-22 DIAGNOSIS — J449 Chronic obstructive pulmonary disease, unspecified: Secondary | ICD-10-CM | POA: Diagnosis not present

## 2023-11-22 DIAGNOSIS — B9561 Methicillin susceptible Staphylococcus aureus infection as the cause of diseases classified elsewhere: Secondary | ICD-10-CM | POA: Diagnosis not present

## 2023-11-22 DIAGNOSIS — N179 Acute kidney failure, unspecified: Secondary | ICD-10-CM | POA: Diagnosis not present

## 2023-11-22 DIAGNOSIS — M4626 Osteomyelitis of vertebra, lumbar region: Secondary | ICD-10-CM | POA: Diagnosis not present

## 2023-11-22 DIAGNOSIS — T368X5D Adverse effect of other systemic antibiotics, subsequent encounter: Secondary | ICD-10-CM | POA: Diagnosis not present

## 2023-11-22 DIAGNOSIS — R197 Diarrhea, unspecified: Secondary | ICD-10-CM | POA: Diagnosis not present

## 2023-11-22 DIAGNOSIS — M4150 Other secondary scoliosis, site unspecified: Secondary | ICD-10-CM | POA: Diagnosis not present

## 2023-11-22 DIAGNOSIS — K6812 Psoas muscle abscess: Secondary | ICD-10-CM | POA: Diagnosis not present

## 2023-11-22 DIAGNOSIS — M4646 Discitis, unspecified, lumbar region: Secondary | ICD-10-CM | POA: Diagnosis not present

## 2023-11-22 DIAGNOSIS — Z87891 Personal history of nicotine dependence: Secondary | ICD-10-CM | POA: Diagnosis not present

## 2023-11-22 DIAGNOSIS — M4712 Other spondylosis with myelopathy, cervical region: Secondary | ICD-10-CM | POA: Diagnosis not present

## 2023-11-22 DIAGNOSIS — I110 Hypertensive heart disease with heart failure: Secondary | ICD-10-CM | POA: Diagnosis not present

## 2023-11-22 DIAGNOSIS — H02833 Dermatochalasis of right eye, unspecified eyelid: Secondary | ICD-10-CM | POA: Diagnosis not present

## 2023-11-22 DIAGNOSIS — I25810 Atherosclerosis of coronary artery bypass graft(s) without angina pectoris: Secondary | ICD-10-CM | POA: Diagnosis not present

## 2023-11-25 DIAGNOSIS — G061 Intraspinal abscess and granuloma: Secondary | ICD-10-CM | POA: Diagnosis not present

## 2023-11-25 DIAGNOSIS — M4150 Other secondary scoliosis, site unspecified: Secondary | ICD-10-CM | POA: Diagnosis not present

## 2023-11-25 DIAGNOSIS — Z951 Presence of aortocoronary bypass graft: Secondary | ICD-10-CM | POA: Diagnosis not present

## 2023-11-25 DIAGNOSIS — N179 Acute kidney failure, unspecified: Secondary | ICD-10-CM | POA: Diagnosis not present

## 2023-11-25 DIAGNOSIS — D7212 Drug rash with eosinophilia and systemic symptoms syndrome: Secondary | ICD-10-CM | POA: Diagnosis not present

## 2023-11-25 DIAGNOSIS — R197 Diarrhea, unspecified: Secondary | ICD-10-CM | POA: Diagnosis not present

## 2023-11-25 DIAGNOSIS — I110 Hypertensive heart disease with heart failure: Secondary | ICD-10-CM | POA: Diagnosis not present

## 2023-11-25 DIAGNOSIS — B9561 Methicillin susceptible Staphylococcus aureus infection as the cause of diseases classified elsewhere: Secondary | ICD-10-CM | POA: Diagnosis not present

## 2023-11-25 DIAGNOSIS — I5030 Unspecified diastolic (congestive) heart failure: Secondary | ICD-10-CM | POA: Diagnosis not present

## 2023-11-25 DIAGNOSIS — M4608 Spinal enthesopathy, sacral and sacrococcygeal region: Secondary | ICD-10-CM | POA: Diagnosis not present

## 2023-11-25 DIAGNOSIS — M4802 Spinal stenosis, cervical region: Secondary | ICD-10-CM | POA: Diagnosis not present

## 2023-11-25 DIAGNOSIS — J449 Chronic obstructive pulmonary disease, unspecified: Secondary | ICD-10-CM | POA: Diagnosis not present

## 2023-11-25 DIAGNOSIS — M4712 Other spondylosis with myelopathy, cervical region: Secondary | ICD-10-CM | POA: Diagnosis not present

## 2023-11-25 DIAGNOSIS — M4626 Osteomyelitis of vertebra, lumbar region: Secondary | ICD-10-CM | POA: Diagnosis not present

## 2023-11-25 DIAGNOSIS — I4819 Other persistent atrial fibrillation: Secondary | ICD-10-CM | POA: Diagnosis not present

## 2023-11-25 DIAGNOSIS — Z87891 Personal history of nicotine dependence: Secondary | ICD-10-CM | POA: Diagnosis not present

## 2023-11-25 DIAGNOSIS — B9562 Methicillin resistant Staphylococcus aureus infection as the cause of diseases classified elsewhere: Secondary | ICD-10-CM | POA: Diagnosis not present

## 2023-11-25 DIAGNOSIS — M19011 Primary osteoarthritis, right shoulder: Secondary | ICD-10-CM | POA: Diagnosis not present

## 2023-11-25 DIAGNOSIS — D649 Anemia, unspecified: Secondary | ICD-10-CM | POA: Diagnosis not present

## 2023-11-25 DIAGNOSIS — K6812 Psoas muscle abscess: Secondary | ICD-10-CM | POA: Diagnosis not present

## 2023-11-25 DIAGNOSIS — T368X5D Adverse effect of other systemic antibiotics, subsequent encounter: Secondary | ICD-10-CM | POA: Diagnosis not present

## 2023-11-25 DIAGNOSIS — M4646 Discitis, unspecified, lumbar region: Secondary | ICD-10-CM | POA: Diagnosis not present

## 2023-11-25 DIAGNOSIS — H02833 Dermatochalasis of right eye, unspecified eyelid: Secondary | ICD-10-CM | POA: Diagnosis not present

## 2023-11-25 DIAGNOSIS — I25810 Atherosclerosis of coronary artery bypass graft(s) without angina pectoris: Secondary | ICD-10-CM | POA: Diagnosis not present

## 2023-11-25 DIAGNOSIS — M7021 Olecranon bursitis, right elbow: Secondary | ICD-10-CM | POA: Diagnosis not present

## 2023-11-25 DIAGNOSIS — E43 Unspecified severe protein-calorie malnutrition: Secondary | ICD-10-CM | POA: Diagnosis not present

## 2023-11-25 DIAGNOSIS — G8929 Other chronic pain: Secondary | ICD-10-CM | POA: Diagnosis not present

## 2023-11-26 DIAGNOSIS — G062 Extradural and subdural abscess, unspecified: Secondary | ICD-10-CM | POA: Diagnosis not present

## 2023-11-26 DIAGNOSIS — M4646 Discitis, unspecified, lumbar region: Secondary | ICD-10-CM | POA: Diagnosis not present

## 2023-11-26 DIAGNOSIS — E43 Unspecified severe protein-calorie malnutrition: Secondary | ICD-10-CM | POA: Diagnosis not present

## 2023-11-26 DIAGNOSIS — M19011 Primary osteoarthritis, right shoulder: Secondary | ICD-10-CM | POA: Diagnosis not present

## 2023-11-26 DIAGNOSIS — I25810 Atherosclerosis of coronary artery bypass graft(s) without angina pectoris: Secondary | ICD-10-CM | POA: Diagnosis not present

## 2023-11-26 DIAGNOSIS — R21 Rash and other nonspecific skin eruption: Secondary | ICD-10-CM | POA: Diagnosis not present

## 2023-11-26 DIAGNOSIS — N179 Acute kidney failure, unspecified: Secondary | ICD-10-CM | POA: Diagnosis not present

## 2023-11-26 DIAGNOSIS — Z951 Presence of aortocoronary bypass graft: Secondary | ICD-10-CM | POA: Diagnosis not present

## 2023-11-26 DIAGNOSIS — H02833 Dermatochalasis of right eye, unspecified eyelid: Secondary | ICD-10-CM | POA: Diagnosis not present

## 2023-11-26 DIAGNOSIS — B9561 Methicillin susceptible Staphylococcus aureus infection as the cause of diseases classified elsewhere: Secondary | ICD-10-CM | POA: Diagnosis not present

## 2023-11-26 DIAGNOSIS — R197 Diarrhea, unspecified: Secondary | ICD-10-CM | POA: Diagnosis not present

## 2023-11-26 DIAGNOSIS — M7021 Olecranon bursitis, right elbow: Secondary | ICD-10-CM | POA: Diagnosis not present

## 2023-11-26 DIAGNOSIS — D649 Anemia, unspecified: Secondary | ICD-10-CM | POA: Diagnosis not present

## 2023-11-26 DIAGNOSIS — G061 Intraspinal abscess and granuloma: Secondary | ICD-10-CM | POA: Diagnosis not present

## 2023-11-26 DIAGNOSIS — G8929 Other chronic pain: Secondary | ICD-10-CM | POA: Diagnosis not present

## 2023-11-26 DIAGNOSIS — Z87891 Personal history of nicotine dependence: Secondary | ICD-10-CM | POA: Diagnosis not present

## 2023-11-26 DIAGNOSIS — M4620 Osteomyelitis of vertebra, site unspecified: Secondary | ICD-10-CM | POA: Diagnosis not present

## 2023-11-26 DIAGNOSIS — T368X5D Adverse effect of other systemic antibiotics, subsequent encounter: Secondary | ICD-10-CM | POA: Diagnosis not present

## 2023-11-26 DIAGNOSIS — J449 Chronic obstructive pulmonary disease, unspecified: Secondary | ICD-10-CM | POA: Diagnosis not present

## 2023-11-26 DIAGNOSIS — Z09 Encounter for follow-up examination after completed treatment for conditions other than malignant neoplasm: Secondary | ICD-10-CM | POA: Diagnosis not present

## 2023-11-26 DIAGNOSIS — I110 Hypertensive heart disease with heart failure: Secondary | ICD-10-CM | POA: Diagnosis not present

## 2023-11-26 DIAGNOSIS — D7212 Drug rash with eosinophilia and systemic symptoms syndrome: Secondary | ICD-10-CM | POA: Diagnosis not present

## 2023-11-26 DIAGNOSIS — M4626 Osteomyelitis of vertebra, lumbar region: Secondary | ICD-10-CM | POA: Diagnosis not present

## 2023-11-26 DIAGNOSIS — A4902 Methicillin resistant Staphylococcus aureus infection, unspecified site: Secondary | ICD-10-CM | POA: Diagnosis not present

## 2023-11-26 DIAGNOSIS — Z792 Long term (current) use of antibiotics: Secondary | ICD-10-CM | POA: Diagnosis not present

## 2023-11-26 DIAGNOSIS — M4150 Other secondary scoliosis, site unspecified: Secondary | ICD-10-CM | POA: Diagnosis not present

## 2023-11-26 DIAGNOSIS — K6812 Psoas muscle abscess: Secondary | ICD-10-CM | POA: Diagnosis not present

## 2023-11-26 DIAGNOSIS — I5030 Unspecified diastolic (congestive) heart failure: Secondary | ICD-10-CM | POA: Diagnosis not present

## 2023-11-26 DIAGNOSIS — M4802 Spinal stenosis, cervical region: Secondary | ICD-10-CM | POA: Diagnosis not present

## 2023-11-26 DIAGNOSIS — T50905A Adverse effect of unspecified drugs, medicaments and biological substances, initial encounter: Secondary | ICD-10-CM | POA: Diagnosis not present

## 2023-11-26 DIAGNOSIS — I4819 Other persistent atrial fibrillation: Secondary | ICD-10-CM | POA: Diagnosis not present

## 2023-11-26 DIAGNOSIS — M4712 Other spondylosis with myelopathy, cervical region: Secondary | ICD-10-CM | POA: Diagnosis not present

## 2023-11-27 DIAGNOSIS — I10 Essential (primary) hypertension: Secondary | ICD-10-CM | POA: Diagnosis not present

## 2023-11-28 DIAGNOSIS — J449 Chronic obstructive pulmonary disease, unspecified: Secondary | ICD-10-CM | POA: Diagnosis not present

## 2023-11-28 DIAGNOSIS — G061 Intraspinal abscess and granuloma: Secondary | ICD-10-CM | POA: Diagnosis not present

## 2023-11-28 DIAGNOSIS — Z6827 Body mass index (BMI) 27.0-27.9, adult: Secondary | ICD-10-CM | POA: Diagnosis not present

## 2023-11-29 DIAGNOSIS — D7212 Drug rash with eosinophilia and systemic symptoms syndrome: Secondary | ICD-10-CM | POA: Diagnosis not present

## 2023-11-29 DIAGNOSIS — M4626 Osteomyelitis of vertebra, lumbar region: Secondary | ICD-10-CM | POA: Diagnosis not present

## 2023-11-29 DIAGNOSIS — D649 Anemia, unspecified: Secondary | ICD-10-CM | POA: Diagnosis not present

## 2023-11-29 DIAGNOSIS — Z87891 Personal history of nicotine dependence: Secondary | ICD-10-CM | POA: Diagnosis not present

## 2023-11-29 DIAGNOSIS — I25810 Atherosclerosis of coronary artery bypass graft(s) without angina pectoris: Secondary | ICD-10-CM | POA: Diagnosis not present

## 2023-11-29 DIAGNOSIS — H02833 Dermatochalasis of right eye, unspecified eyelid: Secondary | ICD-10-CM | POA: Diagnosis not present

## 2023-11-29 DIAGNOSIS — M7021 Olecranon bursitis, right elbow: Secondary | ICD-10-CM | POA: Diagnosis not present

## 2023-11-29 DIAGNOSIS — E43 Unspecified severe protein-calorie malnutrition: Secondary | ICD-10-CM | POA: Diagnosis not present

## 2023-11-29 DIAGNOSIS — B9561 Methicillin susceptible Staphylococcus aureus infection as the cause of diseases classified elsewhere: Secondary | ICD-10-CM | POA: Diagnosis not present

## 2023-11-29 DIAGNOSIS — I110 Hypertensive heart disease with heart failure: Secondary | ICD-10-CM | POA: Diagnosis not present

## 2023-11-29 DIAGNOSIS — N179 Acute kidney failure, unspecified: Secondary | ICD-10-CM | POA: Diagnosis not present

## 2023-11-29 DIAGNOSIS — I4819 Other persistent atrial fibrillation: Secondary | ICD-10-CM | POA: Diagnosis not present

## 2023-11-29 DIAGNOSIS — R197 Diarrhea, unspecified: Secondary | ICD-10-CM | POA: Diagnosis not present

## 2023-11-29 DIAGNOSIS — T368X5D Adverse effect of other systemic antibiotics, subsequent encounter: Secondary | ICD-10-CM | POA: Diagnosis not present

## 2023-11-29 DIAGNOSIS — G061 Intraspinal abscess and granuloma: Secondary | ICD-10-CM | POA: Diagnosis not present

## 2023-11-29 DIAGNOSIS — M4150 Other secondary scoliosis, site unspecified: Secondary | ICD-10-CM | POA: Diagnosis not present

## 2023-11-29 DIAGNOSIS — M4646 Discitis, unspecified, lumbar region: Secondary | ICD-10-CM | POA: Diagnosis not present

## 2023-11-29 DIAGNOSIS — G8929 Other chronic pain: Secondary | ICD-10-CM | POA: Diagnosis not present

## 2023-11-29 DIAGNOSIS — K6812 Psoas muscle abscess: Secondary | ICD-10-CM | POA: Diagnosis not present

## 2023-11-29 DIAGNOSIS — M4802 Spinal stenosis, cervical region: Secondary | ICD-10-CM | POA: Diagnosis not present

## 2023-11-29 DIAGNOSIS — J449 Chronic obstructive pulmonary disease, unspecified: Secondary | ICD-10-CM | POA: Diagnosis not present

## 2023-11-29 DIAGNOSIS — Z951 Presence of aortocoronary bypass graft: Secondary | ICD-10-CM | POA: Diagnosis not present

## 2023-11-29 DIAGNOSIS — M4712 Other spondylosis with myelopathy, cervical region: Secondary | ICD-10-CM | POA: Diagnosis not present

## 2023-11-29 DIAGNOSIS — I5030 Unspecified diastolic (congestive) heart failure: Secondary | ICD-10-CM | POA: Diagnosis not present

## 2023-11-29 DIAGNOSIS — M19011 Primary osteoarthritis, right shoulder: Secondary | ICD-10-CM | POA: Diagnosis not present

## 2023-12-03 DIAGNOSIS — I4819 Other persistent atrial fibrillation: Secondary | ICD-10-CM | POA: Diagnosis not present

## 2023-12-03 DIAGNOSIS — M19011 Primary osteoarthritis, right shoulder: Secondary | ICD-10-CM | POA: Diagnosis not present

## 2023-12-03 DIAGNOSIS — B9561 Methicillin susceptible Staphylococcus aureus infection as the cause of diseases classified elsewhere: Secondary | ICD-10-CM | POA: Diagnosis not present

## 2023-12-03 DIAGNOSIS — M4150 Other secondary scoliosis, site unspecified: Secondary | ICD-10-CM | POA: Diagnosis not present

## 2023-12-03 DIAGNOSIS — I25810 Atherosclerosis of coronary artery bypass graft(s) without angina pectoris: Secondary | ICD-10-CM | POA: Diagnosis not present

## 2023-12-03 DIAGNOSIS — J449 Chronic obstructive pulmonary disease, unspecified: Secondary | ICD-10-CM | POA: Diagnosis not present

## 2023-12-03 DIAGNOSIS — D7212 Drug rash with eosinophilia and systemic symptoms syndrome: Secondary | ICD-10-CM | POA: Diagnosis not present

## 2023-12-03 DIAGNOSIS — Z951 Presence of aortocoronary bypass graft: Secondary | ICD-10-CM | POA: Diagnosis not present

## 2023-12-03 DIAGNOSIS — Z87891 Personal history of nicotine dependence: Secondary | ICD-10-CM | POA: Diagnosis not present

## 2023-12-03 DIAGNOSIS — M4802 Spinal stenosis, cervical region: Secondary | ICD-10-CM | POA: Diagnosis not present

## 2023-12-03 DIAGNOSIS — M4646 Discitis, unspecified, lumbar region: Secondary | ICD-10-CM | POA: Diagnosis not present

## 2023-12-03 DIAGNOSIS — H02833 Dermatochalasis of right eye, unspecified eyelid: Secondary | ICD-10-CM | POA: Diagnosis not present

## 2023-12-03 DIAGNOSIS — G8929 Other chronic pain: Secondary | ICD-10-CM | POA: Diagnosis not present

## 2023-12-03 DIAGNOSIS — G061 Intraspinal abscess and granuloma: Secondary | ICD-10-CM | POA: Diagnosis not present

## 2023-12-03 DIAGNOSIS — E43 Unspecified severe protein-calorie malnutrition: Secondary | ICD-10-CM | POA: Diagnosis not present

## 2023-12-03 DIAGNOSIS — I110 Hypertensive heart disease with heart failure: Secondary | ICD-10-CM | POA: Diagnosis not present

## 2023-12-03 DIAGNOSIS — T368X5D Adverse effect of other systemic antibiotics, subsequent encounter: Secondary | ICD-10-CM | POA: Diagnosis not present

## 2023-12-03 DIAGNOSIS — I5030 Unspecified diastolic (congestive) heart failure: Secondary | ICD-10-CM | POA: Diagnosis not present

## 2023-12-03 DIAGNOSIS — N179 Acute kidney failure, unspecified: Secondary | ICD-10-CM | POA: Diagnosis not present

## 2023-12-03 DIAGNOSIS — M7021 Olecranon bursitis, right elbow: Secondary | ICD-10-CM | POA: Diagnosis not present

## 2023-12-03 DIAGNOSIS — M4626 Osteomyelitis of vertebra, lumbar region: Secondary | ICD-10-CM | POA: Diagnosis not present

## 2023-12-03 DIAGNOSIS — D649 Anemia, unspecified: Secondary | ICD-10-CM | POA: Diagnosis not present

## 2023-12-03 DIAGNOSIS — M4712 Other spondylosis with myelopathy, cervical region: Secondary | ICD-10-CM | POA: Diagnosis not present

## 2023-12-03 DIAGNOSIS — R197 Diarrhea, unspecified: Secondary | ICD-10-CM | POA: Diagnosis not present

## 2023-12-03 DIAGNOSIS — K6812 Psoas muscle abscess: Secondary | ICD-10-CM | POA: Diagnosis not present

## 2023-12-04 DIAGNOSIS — T368X5D Adverse effect of other systemic antibiotics, subsequent encounter: Secondary | ICD-10-CM | POA: Diagnosis not present

## 2023-12-04 DIAGNOSIS — G8929 Other chronic pain: Secondary | ICD-10-CM | POA: Diagnosis not present

## 2023-12-04 DIAGNOSIS — I110 Hypertensive heart disease with heart failure: Secondary | ICD-10-CM | POA: Diagnosis not present

## 2023-12-04 DIAGNOSIS — G061 Intraspinal abscess and granuloma: Secondary | ICD-10-CM | POA: Diagnosis not present

## 2023-12-04 DIAGNOSIS — D649 Anemia, unspecified: Secondary | ICD-10-CM | POA: Diagnosis not present

## 2023-12-04 DIAGNOSIS — R197 Diarrhea, unspecified: Secondary | ICD-10-CM | POA: Diagnosis not present

## 2023-12-04 DIAGNOSIS — M19011 Primary osteoarthritis, right shoulder: Secondary | ICD-10-CM | POA: Diagnosis not present

## 2023-12-04 DIAGNOSIS — K6812 Psoas muscle abscess: Secondary | ICD-10-CM | POA: Diagnosis not present

## 2023-12-04 DIAGNOSIS — Z87891 Personal history of nicotine dependence: Secondary | ICD-10-CM | POA: Diagnosis not present

## 2023-12-04 DIAGNOSIS — Z951 Presence of aortocoronary bypass graft: Secondary | ICD-10-CM | POA: Diagnosis not present

## 2023-12-04 DIAGNOSIS — D7212 Drug rash with eosinophilia and systemic symptoms syndrome: Secondary | ICD-10-CM | POA: Diagnosis not present

## 2023-12-04 DIAGNOSIS — M4802 Spinal stenosis, cervical region: Secondary | ICD-10-CM | POA: Diagnosis not present

## 2023-12-04 DIAGNOSIS — M4712 Other spondylosis with myelopathy, cervical region: Secondary | ICD-10-CM | POA: Diagnosis not present

## 2023-12-04 DIAGNOSIS — M7021 Olecranon bursitis, right elbow: Secondary | ICD-10-CM | POA: Diagnosis not present

## 2023-12-04 DIAGNOSIS — I4819 Other persistent atrial fibrillation: Secondary | ICD-10-CM | POA: Diagnosis not present

## 2023-12-04 DIAGNOSIS — N179 Acute kidney failure, unspecified: Secondary | ICD-10-CM | POA: Diagnosis not present

## 2023-12-04 DIAGNOSIS — I25810 Atherosclerosis of coronary artery bypass graft(s) without angina pectoris: Secondary | ICD-10-CM | POA: Diagnosis not present

## 2023-12-04 DIAGNOSIS — M4646 Discitis, unspecified, lumbar region: Secondary | ICD-10-CM | POA: Diagnosis not present

## 2023-12-04 DIAGNOSIS — E43 Unspecified severe protein-calorie malnutrition: Secondary | ICD-10-CM | POA: Diagnosis not present

## 2023-12-04 DIAGNOSIS — M4150 Other secondary scoliosis, site unspecified: Secondary | ICD-10-CM | POA: Diagnosis not present

## 2023-12-04 DIAGNOSIS — I5030 Unspecified diastolic (congestive) heart failure: Secondary | ICD-10-CM | POA: Diagnosis not present

## 2023-12-04 DIAGNOSIS — M4626 Osteomyelitis of vertebra, lumbar region: Secondary | ICD-10-CM | POA: Diagnosis not present

## 2023-12-04 DIAGNOSIS — J449 Chronic obstructive pulmonary disease, unspecified: Secondary | ICD-10-CM | POA: Diagnosis not present

## 2023-12-04 DIAGNOSIS — H02833 Dermatochalasis of right eye, unspecified eyelid: Secondary | ICD-10-CM | POA: Diagnosis not present

## 2023-12-04 DIAGNOSIS — B9561 Methicillin susceptible Staphylococcus aureus infection as the cause of diseases classified elsewhere: Secondary | ICD-10-CM | POA: Diagnosis not present

## 2023-12-05 DIAGNOSIS — R011 Cardiac murmur, unspecified: Secondary | ICD-10-CM | POA: Diagnosis not present

## 2023-12-05 DIAGNOSIS — I083 Combined rheumatic disorders of mitral, aortic and tricuspid valves: Secondary | ICD-10-CM | POA: Diagnosis not present

## 2023-12-05 DIAGNOSIS — I361 Nonrheumatic tricuspid (valve) insufficiency: Secondary | ICD-10-CM | POA: Diagnosis not present

## 2023-12-05 DIAGNOSIS — I371 Nonrheumatic pulmonary valve insufficiency: Secondary | ICD-10-CM | POA: Diagnosis not present

## 2023-12-05 DIAGNOSIS — I517 Cardiomegaly: Secondary | ICD-10-CM | POA: Diagnosis not present

## 2023-12-05 DIAGNOSIS — I350 Nonrheumatic aortic (valve) stenosis: Secondary | ICD-10-CM | POA: Diagnosis not present

## 2023-12-06 DIAGNOSIS — M4646 Discitis, unspecified, lumbar region: Secondary | ICD-10-CM | POA: Diagnosis not present

## 2023-12-06 DIAGNOSIS — B9561 Methicillin susceptible Staphylococcus aureus infection as the cause of diseases classified elsewhere: Secondary | ICD-10-CM | POA: Diagnosis not present

## 2023-12-06 DIAGNOSIS — G061 Intraspinal abscess and granuloma: Secondary | ICD-10-CM | POA: Diagnosis not present

## 2023-12-06 DIAGNOSIS — I25810 Atherosclerosis of coronary artery bypass graft(s) without angina pectoris: Secondary | ICD-10-CM | POA: Diagnosis not present

## 2023-12-06 DIAGNOSIS — M19011 Primary osteoarthritis, right shoulder: Secondary | ICD-10-CM | POA: Diagnosis not present

## 2023-12-06 DIAGNOSIS — I5030 Unspecified diastolic (congestive) heart failure: Secondary | ICD-10-CM | POA: Diagnosis not present

## 2023-12-06 DIAGNOSIS — M7021 Olecranon bursitis, right elbow: Secondary | ICD-10-CM | POA: Diagnosis not present

## 2023-12-06 DIAGNOSIS — M4712 Other spondylosis with myelopathy, cervical region: Secondary | ICD-10-CM | POA: Diagnosis not present

## 2023-12-06 DIAGNOSIS — H02833 Dermatochalasis of right eye, unspecified eyelid: Secondary | ICD-10-CM | POA: Diagnosis not present

## 2023-12-06 DIAGNOSIS — M4626 Osteomyelitis of vertebra, lumbar region: Secondary | ICD-10-CM | POA: Diagnosis not present

## 2023-12-06 DIAGNOSIS — J449 Chronic obstructive pulmonary disease, unspecified: Secondary | ICD-10-CM | POA: Diagnosis not present

## 2023-12-06 DIAGNOSIS — N179 Acute kidney failure, unspecified: Secondary | ICD-10-CM | POA: Diagnosis not present

## 2023-12-06 DIAGNOSIS — I4819 Other persistent atrial fibrillation: Secondary | ICD-10-CM | POA: Diagnosis not present

## 2023-12-06 DIAGNOSIS — Z87891 Personal history of nicotine dependence: Secondary | ICD-10-CM | POA: Diagnosis not present

## 2023-12-06 DIAGNOSIS — M4150 Other secondary scoliosis, site unspecified: Secondary | ICD-10-CM | POA: Diagnosis not present

## 2023-12-06 DIAGNOSIS — M4802 Spinal stenosis, cervical region: Secondary | ICD-10-CM | POA: Diagnosis not present

## 2023-12-06 DIAGNOSIS — K6812 Psoas muscle abscess: Secondary | ICD-10-CM | POA: Diagnosis not present

## 2023-12-06 DIAGNOSIS — E43 Unspecified severe protein-calorie malnutrition: Secondary | ICD-10-CM | POA: Diagnosis not present

## 2023-12-06 DIAGNOSIS — R197 Diarrhea, unspecified: Secondary | ICD-10-CM | POA: Diagnosis not present

## 2023-12-06 DIAGNOSIS — D7212 Drug rash with eosinophilia and systemic symptoms syndrome: Secondary | ICD-10-CM | POA: Diagnosis not present

## 2023-12-06 DIAGNOSIS — G8929 Other chronic pain: Secondary | ICD-10-CM | POA: Diagnosis not present

## 2023-12-06 DIAGNOSIS — T368X5D Adverse effect of other systemic antibiotics, subsequent encounter: Secondary | ICD-10-CM | POA: Diagnosis not present

## 2023-12-06 DIAGNOSIS — Z951 Presence of aortocoronary bypass graft: Secondary | ICD-10-CM | POA: Diagnosis not present

## 2023-12-06 DIAGNOSIS — I110 Hypertensive heart disease with heart failure: Secondary | ICD-10-CM | POA: Diagnosis not present

## 2023-12-06 DIAGNOSIS — D649 Anemia, unspecified: Secondary | ICD-10-CM | POA: Diagnosis not present

## 2023-12-10 DIAGNOSIS — N179 Acute kidney failure, unspecified: Secondary | ICD-10-CM | POA: Diagnosis not present

## 2023-12-10 DIAGNOSIS — G061 Intraspinal abscess and granuloma: Secondary | ICD-10-CM | POA: Diagnosis not present

## 2023-12-10 DIAGNOSIS — Z87891 Personal history of nicotine dependence: Secondary | ICD-10-CM | POA: Diagnosis not present

## 2023-12-10 DIAGNOSIS — B9561 Methicillin susceptible Staphylococcus aureus infection as the cause of diseases classified elsewhere: Secondary | ICD-10-CM | POA: Diagnosis not present

## 2023-12-10 DIAGNOSIS — D7212 Drug rash with eosinophilia and systemic symptoms syndrome: Secondary | ICD-10-CM | POA: Diagnosis not present

## 2023-12-10 DIAGNOSIS — I110 Hypertensive heart disease with heart failure: Secondary | ICD-10-CM | POA: Diagnosis not present

## 2023-12-10 DIAGNOSIS — Z951 Presence of aortocoronary bypass graft: Secondary | ICD-10-CM | POA: Diagnosis not present

## 2023-12-10 DIAGNOSIS — R197 Diarrhea, unspecified: Secondary | ICD-10-CM | POA: Diagnosis not present

## 2023-12-10 DIAGNOSIS — E43 Unspecified severe protein-calorie malnutrition: Secondary | ICD-10-CM | POA: Diagnosis not present

## 2023-12-10 DIAGNOSIS — T368X5D Adverse effect of other systemic antibiotics, subsequent encounter: Secondary | ICD-10-CM | POA: Diagnosis not present

## 2023-12-10 DIAGNOSIS — K6812 Psoas muscle abscess: Secondary | ICD-10-CM | POA: Diagnosis not present

## 2023-12-10 DIAGNOSIS — M4712 Other spondylosis with myelopathy, cervical region: Secondary | ICD-10-CM | POA: Diagnosis not present

## 2023-12-10 DIAGNOSIS — I4819 Other persistent atrial fibrillation: Secondary | ICD-10-CM | POA: Diagnosis not present

## 2023-12-10 DIAGNOSIS — D649 Anemia, unspecified: Secondary | ICD-10-CM | POA: Diagnosis not present

## 2023-12-10 DIAGNOSIS — G8929 Other chronic pain: Secondary | ICD-10-CM | POA: Diagnosis not present

## 2023-12-10 DIAGNOSIS — M4802 Spinal stenosis, cervical region: Secondary | ICD-10-CM | POA: Diagnosis not present

## 2023-12-10 DIAGNOSIS — M4646 Discitis, unspecified, lumbar region: Secondary | ICD-10-CM | POA: Diagnosis not present

## 2023-12-10 DIAGNOSIS — M4626 Osteomyelitis of vertebra, lumbar region: Secondary | ICD-10-CM | POA: Diagnosis not present

## 2023-12-10 DIAGNOSIS — H02833 Dermatochalasis of right eye, unspecified eyelid: Secondary | ICD-10-CM | POA: Diagnosis not present

## 2023-12-10 DIAGNOSIS — M7021 Olecranon bursitis, right elbow: Secondary | ICD-10-CM | POA: Diagnosis not present

## 2023-12-10 DIAGNOSIS — I5030 Unspecified diastolic (congestive) heart failure: Secondary | ICD-10-CM | POA: Diagnosis not present

## 2023-12-10 DIAGNOSIS — M4150 Other secondary scoliosis, site unspecified: Secondary | ICD-10-CM | POA: Diagnosis not present

## 2023-12-10 DIAGNOSIS — I25810 Atherosclerosis of coronary artery bypass graft(s) without angina pectoris: Secondary | ICD-10-CM | POA: Diagnosis not present

## 2023-12-10 DIAGNOSIS — M19011 Primary osteoarthritis, right shoulder: Secondary | ICD-10-CM | POA: Diagnosis not present

## 2023-12-10 DIAGNOSIS — J449 Chronic obstructive pulmonary disease, unspecified: Secondary | ICD-10-CM | POA: Diagnosis not present

## 2023-12-11 DIAGNOSIS — I10 Essential (primary) hypertension: Secondary | ICD-10-CM | POA: Diagnosis not present

## 2023-12-11 DIAGNOSIS — D649 Anemia, unspecified: Secondary | ICD-10-CM | POA: Diagnosis not present

## 2023-12-11 DIAGNOSIS — R195 Other fecal abnormalities: Secondary | ICD-10-CM | POA: Diagnosis not present

## 2023-12-11 DIAGNOSIS — M4646 Discitis, unspecified, lumbar region: Secondary | ICD-10-CM | POA: Diagnosis not present

## 2023-12-11 DIAGNOSIS — I5030 Unspecified diastolic (congestive) heart failure: Secondary | ICD-10-CM | POA: Diagnosis not present

## 2023-12-11 DIAGNOSIS — J449 Chronic obstructive pulmonary disease, unspecified: Secondary | ICD-10-CM | POA: Diagnosis not present

## 2023-12-11 DIAGNOSIS — G062 Extradural and subdural abscess, unspecified: Secondary | ICD-10-CM | POA: Diagnosis not present

## 2023-12-11 DIAGNOSIS — R609 Edema, unspecified: Secondary | ICD-10-CM | POA: Diagnosis not present

## 2023-12-13 DIAGNOSIS — Z951 Presence of aortocoronary bypass graft: Secondary | ICD-10-CM | POA: Diagnosis not present

## 2023-12-13 DIAGNOSIS — G8929 Other chronic pain: Secondary | ICD-10-CM | POA: Diagnosis not present

## 2023-12-13 DIAGNOSIS — M4646 Discitis, unspecified, lumbar region: Secondary | ICD-10-CM | POA: Diagnosis not present

## 2023-12-13 DIAGNOSIS — M4712 Other spondylosis with myelopathy, cervical region: Secondary | ICD-10-CM | POA: Diagnosis not present

## 2023-12-13 DIAGNOSIS — I110 Hypertensive heart disease with heart failure: Secondary | ICD-10-CM | POA: Diagnosis not present

## 2023-12-13 DIAGNOSIS — I4819 Other persistent atrial fibrillation: Secondary | ICD-10-CM | POA: Diagnosis not present

## 2023-12-13 DIAGNOSIS — M19011 Primary osteoarthritis, right shoulder: Secondary | ICD-10-CM | POA: Diagnosis not present

## 2023-12-13 DIAGNOSIS — E43 Unspecified severe protein-calorie malnutrition: Secondary | ICD-10-CM | POA: Diagnosis not present

## 2023-12-13 DIAGNOSIS — Z87891 Personal history of nicotine dependence: Secondary | ICD-10-CM | POA: Diagnosis not present

## 2023-12-13 DIAGNOSIS — B9561 Methicillin susceptible Staphylococcus aureus infection as the cause of diseases classified elsewhere: Secondary | ICD-10-CM | POA: Diagnosis not present

## 2023-12-13 DIAGNOSIS — G061 Intraspinal abscess and granuloma: Secondary | ICD-10-CM | POA: Diagnosis not present

## 2023-12-13 DIAGNOSIS — N179 Acute kidney failure, unspecified: Secondary | ICD-10-CM | POA: Diagnosis not present

## 2023-12-13 DIAGNOSIS — D7212 Drug rash with eosinophilia and systemic symptoms syndrome: Secondary | ICD-10-CM | POA: Diagnosis not present

## 2023-12-13 DIAGNOSIS — T368X5D Adverse effect of other systemic antibiotics, subsequent encounter: Secondary | ICD-10-CM | POA: Diagnosis not present

## 2023-12-13 DIAGNOSIS — H02833 Dermatochalasis of right eye, unspecified eyelid: Secondary | ICD-10-CM | POA: Diagnosis not present

## 2023-12-13 DIAGNOSIS — I5030 Unspecified diastolic (congestive) heart failure: Secondary | ICD-10-CM | POA: Diagnosis not present

## 2023-12-13 DIAGNOSIS — I25810 Atherosclerosis of coronary artery bypass graft(s) without angina pectoris: Secondary | ICD-10-CM | POA: Diagnosis not present

## 2023-12-13 DIAGNOSIS — M4626 Osteomyelitis of vertebra, lumbar region: Secondary | ICD-10-CM | POA: Diagnosis not present

## 2023-12-13 DIAGNOSIS — D649 Anemia, unspecified: Secondary | ICD-10-CM | POA: Diagnosis not present

## 2023-12-13 DIAGNOSIS — M7021 Olecranon bursitis, right elbow: Secondary | ICD-10-CM | POA: Diagnosis not present

## 2023-12-13 DIAGNOSIS — M4802 Spinal stenosis, cervical region: Secondary | ICD-10-CM | POA: Diagnosis not present

## 2023-12-13 DIAGNOSIS — R197 Diarrhea, unspecified: Secondary | ICD-10-CM | POA: Diagnosis not present

## 2023-12-13 DIAGNOSIS — J449 Chronic obstructive pulmonary disease, unspecified: Secondary | ICD-10-CM | POA: Diagnosis not present

## 2023-12-13 DIAGNOSIS — M4150 Other secondary scoliosis, site unspecified: Secondary | ICD-10-CM | POA: Diagnosis not present

## 2023-12-13 DIAGNOSIS — K6812 Psoas muscle abscess: Secondary | ICD-10-CM | POA: Diagnosis not present

## 2023-12-17 DIAGNOSIS — D7212 Drug rash with eosinophilia and systemic symptoms syndrome: Secondary | ICD-10-CM | POA: Diagnosis not present

## 2023-12-17 DIAGNOSIS — T50905A Adverse effect of unspecified drugs, medicaments and biological substances, initial encounter: Secondary | ICD-10-CM | POA: Diagnosis not present

## 2023-12-17 DIAGNOSIS — L821 Other seborrheic keratosis: Secondary | ICD-10-CM | POA: Diagnosis not present
